# Patient Record
Sex: Male | Born: 1975 | Race: White | Marital: Married | Smoking: Never smoker
Health system: Southern US, Community
[De-identification: ages and names within clinical notes are randomized; demographics above are authoritative.]

## PROBLEM LIST (undated history)

## (undated) NOTE — *Deleted (*Deleted)
  Echocardiogram 2D Echocardiogram has been performed.  Delcie Roch 12/29/2019, 11:55 AM

---

## 2014-12-11 ENCOUNTER — Other Ambulatory Visit: Payer: Self-pay | Admitting: Orthopedic Surgery

## 2014-12-11 DIAGNOSIS — Z139 Encounter for screening, unspecified: Secondary | ICD-10-CM

## 2014-12-11 DIAGNOSIS — M25511 Pain in right shoulder: Secondary | ICD-10-CM

## 2014-12-24 ENCOUNTER — Inpatient Hospital Stay: Admission: RE | Admit: 2014-12-24 | Payer: Self-pay | Source: Ambulatory Visit

## 2014-12-24 ENCOUNTER — Other Ambulatory Visit: Payer: Self-pay

## 2014-12-27 ENCOUNTER — Ambulatory Visit
Admission: RE | Admit: 2014-12-27 | Discharge: 2014-12-27 | Disposition: A | Discharge status: Home / Self Care | Payer: 59 | Source: Ambulatory Visit | Attending: Orthopedic Surgery | Admitting: Orthopedic Surgery

## 2014-12-27 DIAGNOSIS — M25511 Pain in right shoulder: Secondary | ICD-10-CM

## 2014-12-27 DIAGNOSIS — Z139 Encounter for screening, unspecified: Secondary | ICD-10-CM

## 2018-11-24 ENCOUNTER — Other Ambulatory Visit: Payer: Self-pay

## 2018-11-24 DIAGNOSIS — Z20822 Contact with and (suspected) exposure to covid-19: Secondary | ICD-10-CM

## 2018-11-26 LAB — NOVEL CORONAVIRUS, NAA: SARS-CoV-2, NAA: NOT DETECTED

## 2019-09-20 ENCOUNTER — Ambulatory Visit (INDEPENDENT_AMBULATORY_CARE_PROVIDER_SITE_OTHER): Payer: 59 | Admitting: Podiatry

## 2019-09-20 ENCOUNTER — Encounter: Payer: Self-pay | Admitting: Podiatry

## 2019-09-20 ENCOUNTER — Other Ambulatory Visit: Payer: Self-pay

## 2019-09-20 DIAGNOSIS — M79675 Pain in left toe(s): Secondary | ICD-10-CM | POA: Diagnosis not present

## 2019-09-20 DIAGNOSIS — S91209A Unspecified open wound of unspecified toe(s) with damage to nail, initial encounter: Secondary | ICD-10-CM | POA: Diagnosis not present

## 2019-09-20 DIAGNOSIS — S91209S Unspecified open wound of unspecified toe(s) with damage to nail, sequela: Secondary | ICD-10-CM

## 2019-09-20 NOTE — Progress Notes (Signed)
  Subjective:  Patient ID: Antonio Santos, male    DOB: 1975/04/01,  MRN: [id number]  Chief Complaint  Patient presents with  . Nail Problem    Left 1st toenail lost in injury 6-7 months ago, pt states nail is growing back painful.     44 y.o. male presents with the above complaint. History confirmed with patient.  Nail continues to slowly regrow, as soon as it regrows it is quite painful and he tries to pick some of it out but cannot get all of it.  Hurts on both borders and beneath the nail fold.  Objective:  Physical Exam: warm, good capillary refill, no trophic changes or ulcerative lesions, normal DP and PT pulses and normal sensory exam. Left Foot: Left hallux with previous traumatic nail avulsion, I the nailbed is well-healed, very small nail regrowth noted in the nail folds proximally Assessment:   1. Traumatic avulsion of nail plate of toe, sequela   2. Pain in toe of left foot      Plan:  Patient was evaluated and treated and all questions answered.   -Discussed treatment for the condition of the toe in detail with the patient, the nature of nail biology nail regrowth.  He is okay with no longer having a nail and would like an avulsion of the nail with matricectomy.  Today there is not much nail to avulse, and I do not think phenol matricectomy would provide an adequate matricectomy in the office today.  We discussed surgical matricectomy to be performed in the office surgical suite.  Risks and benefits of the procedure were discussed including wound healing complications and need for sutures to heal.  Informed consent was signed and will be scheduled in 2 weeks after he is back from vacation.  Sharl Ma, DPM 09/20/2019     No follow-ups on file.

## 2019-09-26 ENCOUNTER — Telehealth: Payer: Self-pay

## 2019-09-26 NOTE — Telephone Encounter (Signed)
Antonio Santos called to cancel his office surgery on 10/04/19. He stated he has to go out of town for business. He stated he will call back to reschedule once he gets back. Notified Dr. Lilian Kapur

## 2019-09-26 NOTE — Telephone Encounter (Signed)
Sounds good, thank you 

## 2019-10-04 ENCOUNTER — Ambulatory Visit: Payer: 59 | Admitting: Podiatry

## 2019-10-19 ENCOUNTER — Ambulatory Visit: Payer: 59 | Admitting: Podiatry

## 2019-12-28 ENCOUNTER — Emergency Department
Admission: EM | Admit: 2019-12-28 | Discharge: 2019-12-28 | Disposition: A | Discharge status: Home / Self Care | Payer: 59 | Attending: Emergency Medicine | Admitting: Emergency Medicine

## 2019-12-28 ENCOUNTER — Emergency Department (HOSPITAL_COMMUNITY): Payer: 59

## 2019-12-28 ENCOUNTER — Inpatient Hospital Stay (HOSPITAL_COMMUNITY)
Admission: EM | Admit: 2019-12-28 | Discharge: 2019-12-30 | DRG: 065 | Disposition: A | Discharge status: Home / Self Care | Payer: 59 | Attending: Family Medicine | Admitting: Family Medicine

## 2019-12-28 ENCOUNTER — Other Ambulatory Visit: Payer: Self-pay

## 2019-12-28 ENCOUNTER — Emergency Department: Payer: 59

## 2019-12-28 DIAGNOSIS — M549 Dorsalgia, unspecified: Secondary | ICD-10-CM | POA: Diagnosis present

## 2019-12-28 DIAGNOSIS — I6389 Other cerebral infarction: Secondary | ICD-10-CM | POA: Diagnosis not present

## 2019-12-28 DIAGNOSIS — Z79899 Other long term (current) drug therapy: Secondary | ICD-10-CM | POA: Diagnosis not present

## 2019-12-28 DIAGNOSIS — I639 Cerebral infarction, unspecified: Principal | ICD-10-CM | POA: Diagnosis present

## 2019-12-28 DIAGNOSIS — E785 Hyperlipidemia, unspecified: Secondary | ICD-10-CM | POA: Diagnosis present

## 2019-12-28 DIAGNOSIS — R29713 NIHSS score 13: Secondary | ICD-10-CM | POA: Diagnosis present

## 2019-12-28 DIAGNOSIS — G4733 Obstructive sleep apnea (adult) (pediatric): Secondary | ICD-10-CM | POA: Diagnosis not present

## 2019-12-28 DIAGNOSIS — Z6838 Body mass index (BMI) 38.0-38.9, adult: Secondary | ICD-10-CM

## 2019-12-28 DIAGNOSIS — I63331 Cerebral infarction due to thrombosis of right posterior cerebral artery: Secondary | ICD-10-CM | POA: Diagnosis not present

## 2019-12-28 DIAGNOSIS — H532 Diplopia: Secondary | ICD-10-CM | POA: Diagnosis present

## 2019-12-28 DIAGNOSIS — N179 Acute kidney failure, unspecified: Secondary | ICD-10-CM | POA: Diagnosis present

## 2019-12-28 DIAGNOSIS — Z20822 Contact with and (suspected) exposure to covid-19: Secondary | ICD-10-CM | POA: Diagnosis present

## 2019-12-28 DIAGNOSIS — E7800 Pure hypercholesterolemia, unspecified: Secondary | ICD-10-CM | POA: Diagnosis present

## 2019-12-28 DIAGNOSIS — G8194 Hemiplegia, unspecified affecting left nondominant side: Secondary | ICD-10-CM | POA: Diagnosis present

## 2019-12-28 DIAGNOSIS — E669 Obesity, unspecified: Secondary | ICD-10-CM | POA: Diagnosis present

## 2019-12-28 DIAGNOSIS — I351 Nonrheumatic aortic (valve) insufficiency: Secondary | ICD-10-CM | POA: Diagnosis not present

## 2019-12-28 DIAGNOSIS — I10 Essential (primary) hypertension: Secondary | ICD-10-CM | POA: Diagnosis present

## 2019-12-28 DIAGNOSIS — G8929 Other chronic pain: Secondary | ICD-10-CM | POA: Diagnosis present

## 2019-12-28 DIAGNOSIS — R471 Dysarthria and anarthria: Secondary | ICD-10-CM

## 2019-12-28 LAB — DIFFERENTIAL
Abs Immature Granulocytes: 0.06 10*3/uL (ref 0.00–0.07)
Abs Immature Granulocytes: 0.05 10*3/uL (ref 0.00–0.07)
Basophils Absolute: 0.1 10*3/uL (ref 0.0–0.1)
Basophils Absolute: 0.1 10*3/uL (ref 0.0–0.1)
Basophils Relative: 1 %
Basophils Relative: 1 %
Eosinophils Absolute: 0.2 10*3/uL (ref 0.0–0.5)
Eosinophils Absolute: 0.1 10*3/uL (ref 0.0–0.5)
Eosinophils Relative: 2 %
Eosinophils Relative: 2 %
Immature Granulocytes: 1 %
Immature Granulocytes: 1 %
Lymphocytes Relative: 17 %
Lymphocytes Relative: 19 %
Lymphs Abs: 1.4 10*3/uL (ref 0.7–4.0)
Lymphs Abs: 1.5 10*3/uL (ref 0.7–4.0)
Monocytes Absolute: 0.5 10*3/uL (ref 0.1–1.0)
Monocytes Absolute: 0.5 10*3/uL (ref 0.1–1.0)
Monocytes Relative: 6 %
Monocytes Relative: 7 %
Neutro Abs: 5.9 10*3/uL (ref 1.7–7.7)
Neutro Abs: 5.4 10*3/uL (ref 1.7–7.7)
Neutrophils Relative %: 73 %
Neutrophils Relative %: 70 %

## 2019-12-28 LAB — COMPREHENSIVE METABOLIC PANEL
ALT: 31 U/L (ref 0–44)
ALT: 30 U/L (ref 0–44)
AST: 31 U/L (ref 15–41)
AST: 31 U/L (ref 15–41)
Albumin: 4.9 g/dL (ref 3.5–5.0)
Albumin: 4.6 g/dL (ref 3.5–5.0)
Alkaline Phosphatase: 43 U/L (ref 38–126)
Alkaline Phosphatase: 41 U/L (ref 38–126)
Anion gap: 10 (ref 5–15)
Anion gap: 10 (ref 5–15)
BUN: 17 mg/dL (ref 6–20)
BUN: 15 mg/dL (ref 6–20)
CO2: 27 mmol/L (ref 22–32)
CO2: 25 mmol/L (ref 22–32)
Calcium: 10.3 mg/dL (ref 8.9–10.3)
Calcium: 10 mg/dL (ref 8.9–10.3)
Chloride: 102 mmol/L (ref 98–111)
Chloride: 103 mmol/L (ref 98–111)
Creatinine, Ser: 1.24 mg/dL (ref 0.61–1.24)
Creatinine, Ser: 1.37 mg/dL — ABNORMAL HIGH (ref 0.61–1.24)
GFR, Estimated: >60 mL/min (ref >60)
GFR, Estimated: >60 mL/min (ref >60)
Glucose, Bld: 99 mg/dL (ref 70–99)
Glucose, Bld: 119 mg/dL — ABNORMAL HIGH (ref 70–99)
Potassium: 4.1 mmol/L (ref 3.5–5.1)
Potassium: 3.9 mmol/L (ref 3.5–5.1)
Sodium: 139 mmol/L (ref 135–145)
Sodium: 138 mmol/L (ref 135–145)
Total Bilirubin: 0.8 mg/dL (ref 0.3–1.2)
Total Bilirubin: 0.7 mg/dL (ref 0.3–1.2)
Total Protein: 8.3 g/dL — ABNORMAL HIGH (ref 6.5–8.1)
Total Protein: 7.4 g/dL (ref 6.5–8.1)

## 2019-12-28 LAB — CBC
HCT: 40.2 % (ref 39.0–52.0)
HCT: 41.2 % (ref 39.0–52.0)
Hemoglobin: 14 g/dL (ref 13.0–17.0)
Hemoglobin: 13.9 g/dL (ref 13.0–17.0)
MCH: 30.8 pg (ref 26.0–34.0)
MCH: 30.3 pg (ref 26.0–34.0)
MCHC: 34.8 g/dL (ref 30.0–36.0)
MCHC: 33.7 g/dL (ref 30.0–36.0)
MCV: 88.5 fL (ref 80.0–100.0)
MCV: 90 fL (ref 80.0–100.0)
Platelets: 215 10*3/uL (ref 150–400)
Platelets: 211 10*3/uL (ref 150–400)
RBC: 4.54 MIL/uL (ref 4.22–5.81)
RBC: 4.58 MIL/uL (ref 4.22–5.81)
RDW: 12.1 % (ref 11.5–15.5)
RDW: 12.1 % (ref 11.5–15.5)
WBC: 8 10*3/uL (ref 4.0–10.5)
WBC: 7.6 10*3/uL (ref 4.0–10.5)
nRBC: 0 % (ref 0.0–0.2)
nRBC: 0 % (ref 0.0–0.2)

## 2019-12-28 LAB — PROTIME-INR
INR: 1 (ref 0.8–1.2)
INR: 1 (ref 0.8–1.2)
Prothrombin Time: 12.4 seconds (ref 11.4–15.2)
Prothrombin Time: 13.1 seconds (ref 11.4–15.2)

## 2019-12-28 LAB — I-STAT CHEM 8, ED
BUN: 16 mg/dL (ref 6–20)
Calcium, Ion: 1.19 mmol/L (ref 1.15–1.40)
Chloride: 103 mmol/L (ref 98–111)
Creatinine, Ser: 1.3 mg/dL — ABNORMAL HIGH (ref 0.61–1.24)
Glucose, Bld: 115 mg/dL — ABNORMAL HIGH (ref 70–99)
HCT: 40 % (ref 39.0–52.0)
Hemoglobin: 13.6 g/dL (ref 13.0–17.0)
Potassium: 3.8 mmol/L (ref 3.5–5.1)
Sodium: 139 mmol/L (ref 135–145)
TCO2: 25 mmol/L (ref 22–32)

## 2019-12-28 LAB — CBG MONITORING, ED: Glucose-Capillary: 114 mg/dL — ABNORMAL HIGH (ref 70–99)

## 2019-12-28 LAB — HIV ANTIBODY (ROUTINE TESTING W REFLEX): HIV Screen 4th Generation wRfx: NONREACTIVE

## 2019-12-28 LAB — RESPIRATORY PANEL BY RT PCR (FLU A&B, COVID)
Influenza A by PCR: NEGATIVE
Influenza B by PCR: NEGATIVE
SARS Coronavirus 2 by RT PCR: NEGATIVE

## 2019-12-28 LAB — APTT
aPTT: 25 seconds (ref 24–36)
aPTT: 24 seconds (ref 24–36)

## 2019-12-28 LAB — ANTITHROMBIN III: AntiThromb III Func: 105 % (ref 75–120)

## 2019-12-28 MED ORDER — LORAZEPAM 2 MG/ML IJ SOLN
1.0000 mg | Freq: Once | INTRAMUSCULAR | Status: AC
  Administered 2019-12-28: 1 mg via INTRAVENOUS

## 2019-12-28 MED ORDER — GADOBUTROL 1 MMOL/ML IV SOLN
10.0000 mL | Freq: Once | INTRAVENOUS | Status: AC | PRN
  Administered 2019-12-28: 10 mL via INTRAVENOUS

## 2019-12-28 MED ORDER — CLOPIDOGREL BISULFATE 75 MG PO TABS
75.0000 mg | ORAL_TABLET | Freq: Every day | ORAL | Status: DC
  Administered 2019-12-29 – 2019-12-30 (×2): 75 mg via ORAL
  Filled 2019-12-28 (×2): qty 1

## 2019-12-28 MED ORDER — ATORVASTATIN CALCIUM 40 MG PO TABS: 40.0000 mg | ORAL_TABLET | Freq: Every day | ORAL | Status: DC

## 2019-12-28 MED ORDER — IOHEXOL 350 MG/ML SOLN
100.0000 mL | Freq: Once | INTRAVENOUS | Status: AC | PRN
  Administered 2019-12-28: 100 mL via INTRAVENOUS

## 2019-12-28 MED ORDER — LORAZEPAM 2 MG/ML IJ SOLN
INTRAMUSCULAR | Status: AC
  Filled 2019-12-28: qty 1

## 2019-12-28 MED ORDER — SODIUM CHLORIDE 0.9% FLUSH
3.0000 mL | Freq: Once | INTRAVENOUS | Status: AC
Start: 2019-12-28 — End: 2019-12-29
  Administered 2019-12-29: 3 mL via INTRAVENOUS

## 2019-12-28 MED ORDER — ASPIRIN 81 MG PO CHEW
324.0000 mg | CHEWABLE_TABLET | Freq: Once | ORAL | Status: AC
  Administered 2019-12-28: 324 mg via ORAL
  Filled 2019-12-28: qty 4

## 2019-12-28 MED ORDER — POLYETHYLENE GLYCOL 3350 17 G PO PACK: 17.0000 g | PACK | Freq: Every day | ORAL | Status: DC | PRN

## 2019-12-28 MED ORDER — ACETAMINOPHEN 325 MG PO TABS: 650.0000 mg | ORAL_TABLET | Freq: Four times a day (QID) | ORAL | Status: DC | PRN

## 2019-12-28 MED ORDER — ASPIRIN EC 81 MG PO TBEC
81.0000 mg | DELAYED_RELEASE_TABLET | Freq: Every day | ORAL | Status: DC
  Administered 2019-12-29 – 2019-12-30 (×2): 81 mg via ORAL
  Filled 2019-12-28 (×2): qty 1

## 2019-12-28 MED ORDER — OXYCODONE-ACETAMINOPHEN 5-325 MG PO TABS
1.0000 | ORAL_TABLET | Freq: Four times a day (QID) | ORAL | Status: DC | PRN
  Administered 2019-12-29 – 2019-12-30 (×4): 1 via ORAL
  Filled 2019-12-28 (×4): qty 1

## 2019-12-28 MED ORDER — ACETAMINOPHEN 650 MG RE SUPP: 650.0000 mg | Freq: Four times a day (QID) | RECTAL | Status: DC | PRN

## 2019-12-28 MED ORDER — SODIUM CHLORIDE 0.9% FLUSH
3.0000 mL | Freq: Once | INTRAVENOUS | Status: AC
  Administered 2019-12-28: 3 mL via INTRAVENOUS

## 2019-12-28 NOTE — ED Notes (Signed)
Pt ambulated to bathroom with orders from RN. Pt was stable on feet and leaned more to his left side. Left sided weakness was shown.

## 2019-12-28 NOTE — Code Documentation (Signed)
Stroke Response Nurse Documentation Code Documentation  Antonio Santos is a 44 y.o. male arriving to [facility] ED via [facility] on 10/21 with no significant past medical hx. Code stroke was activated by EMS. Patient from opthalmology where he was LKW at 56. Pt was seen this morning at [facility] after he had sudden onset of double vision at 0645. MRI/MRA completed and both read normal. Pt was discharged and wife took him to her work place which is an opthalmology office to be evaluated. While there at 1420, pt had sudden onset of left facial droop, left sided weakness, right gaze, and left neglect.EMS called and activated a Code Stroke.  Stroke team at the bedside on patient arrival. Labs drawn and patient cleared for CT by Dr. Madilyn Hook. Patient to CT with team. NIHSS 13, see documentation for details and code stroke times. Patient with right gaze preference , left hemianopia, left facial droop, left arm weakness, left leg weakness, dysarthria  and Sensory  neglect on exam. The following imaging was completed:  CT, CTA head and neck, CTP. Patient is not a candidate for tPA due to being outside window.   Patient taken to MRI to confirm no LVO due to abnormal CTA with exam. MRI completed. 1 mg of Ativan given while in scanner due to patient being unable to stay still. See MAR. No LVO noted per MD Wilford Corner. Care/Plan: Taken Back to ED, Q2 VS/mNIHSS, Admit. Bedside handoff with ED RN Byrd Hesselbach.     Lucila Maine  Stroke Response RN

## 2019-12-28 NOTE — ED Notes (Signed)
Pt wife at bedside at this time.  

## 2019-12-28 NOTE — Discharge Instructions (Addendum)
Please follow up with ophthalmology. Please seek medical attention for any high fevers, chest pain, shortness of breath, change in behavior, persistent vomiting, bloody stool or any other new or concerning symptoms.

## 2019-12-28 NOTE — ED Notes (Signed)
Pt back from MRI at this time

## 2019-12-28 NOTE — Progress Notes (Signed)
CODE STROKE- PHARMACY COMMUNICATION   Time CODE STROKE called/page received: 0900  Time response to CODE STROKE was made (in person or via phone): 0910  Time Stroke Kit retrieved from [facility] (only if needed): NA  Name of Provider/Nurse contacted: Dr. Josefina Do ,PharmD Clinical Pharmacist  12/28/2019  9:45 AM

## 2019-12-28 NOTE — Consult Note (Addendum)
Neurology Consultation  Reason for Consult: Code stroke Referring Physician: Cathren Laine, MD  CC: Left-sided weakness  History is obtained from: EMS and notes  HPI: Antonio Santos is a 43 y.o. male past medical history of obesity, back pain, hypercholesterolemia, seen this morning at [facility] hospital for visual disturbance, concerning for posterior circulation stroke with reported negative MRI and discharge to follow-up with ophthalmology presented for evaluation of left-sided symptoms concerning for stroke. This morning at [facility], patient's chief complaint was sudden onset of double vision.  Apparently he [place] the [place] when suddenly he stated that he could not see.  His friend had to grab the wheel and pulled the car over to the side.  When in the ED at [facility] patient notes double vision with both eyes open.  When gazing objects close up, he had no double vision but there was some blurring when looking at close objects on far upward gaze.  He also was noted to have fluctuating vertical diplopia when upper gaze at exit sign that was about 25 feet away.  At that time NIH stroke scale was zero.  MRI brain at that time reportedly showed no intracranial abnormalities especially no acute infarct.  MRA of brain showed no evidence of acute intracranial abnormality.  He was discharged from the ER and recommended to follow-up with ophthalmology.  His wife works as an ophthalmology office and he went to see be ophthalmologist when he was suddenly noticed to have a rightward gaze and left arm flaccidity and left leg weakness along with left facial droop.  EMS was called and patient was brought to [facility] as code stroke.  Patient continued to have symptoms however he was outside the window for TPA.  Patient was immediately brought to CT scan where he had a noncontrast head CT and CTA of head and neck along with perfusion.  CT head showed no acute  intracranial hemorrhage.  Possible small area of acute infarction in the inferior medial right thalamus.  No emergent LVO on CTA  Due to CT findings patient was brought MRI which did confirm that patient had a inferior medial right thalamic infarct, which in hindsight was subtly present during the first MRI done at [facility] as well.   LKW: 6:45 a.m., 12/28/2019 tpa given?: no, out of window Premorbid modified Rankin scale (mRS): 0 NIHSS 1a Level of Conscious.: 0 1b LOC Questions: 0 1c LOC Commands: 0 2 Best Gaze: 1 3 Visual: 2 4 Facial Palsy: 2 5a Motor Arm - left: 3 5b Motor Arm - Right: 0 6a Motor Leg - Left: 2 6b Motor Leg - Right: 0 7 Limb Ataxia: 0 8 Sensory: 0 9 Best Language: 0 10 Dysarthria: 2 11 Extinct. and Inatten.: 1 TOTAL: 13    Past medical history: Back pain Obesity Hypercholesterolemia  No family history on file.  Social History:   has no history on file for tobacco use, alcohol use, and drug use.  Medications  Current Facility-Administered Medications:  .  sodium chloride flush (NS) 0.9 % injection 3 mL, 3 mL, Intravenous, Once, Cathren Laine, MD  Current Outpatient Medications:  .  fenofibrate micronized (LOFIBRA) 200 MG capsule, Take 200 mg by mouth daily. , Disp: , Rfl:  .  oxyCODONE-acetaminophen (PERCOCET/ROXICET) 5-325 MG tablet, Take 1 tablet by mouth 4 (four) times daily as needed., Disp: , Rfl:  .  cyclobenzaprine (FLEXERIL) 10 MG tablet, Take 10 mg by mouth 3 (three) times  daily as needed. (Patient not taking: Reported on 12/28/2019), Disp: , Rfl:  .  traZODone (DESYREL) 100 MG tablet, Take 100 mg by mouth at bedtime. (Patient not taking: Reported on 12/28/2019), Disp: , Rfl:   ROS:    General ROS: negative for - chills, fatigue, fever, night sweats, weight gain or weight loss Psychological ROS: negative for - behavioral disorder, hallucinations, memory difficulties, mood swings or suicidal ideation Ophthalmic ROS: Positive for  - blurry vision, double vision ENT ROS: negative for - epistaxis, nasal discharge, oral lesions, sore throat, tinnitus or vertigo Respiratory ROS: negative for - cough, hemoptysis, shortness of breath or wheezing Cardiovascular ROS: negative for - chest pain, dyspnea on exertion, edema or irregular heartbeat Gastrointestinal ROS: negative for - abdominal pain, diarrhea, hematemesis, nausea/vomiting or stool incontinence Genito-Urinary ROS: negative for - dysuria, hematuria, incontinence or urinary frequency/urgency Musculoskeletal ROS: Positive for -muscular weakness Neurological ROS: as noted in HPI Dermatological ROS: negative for rash and skin lesion changes  Exam: Current vital signs: There were no vitals taken for this visit. Vital signs in last 24 hours: Temp:  [98.9 F (37.2 C)] 98.9 F (37.2 C) (10/21 0853) Pulse Rate:  [82-95] 86 (10/21 1229) Resp:  [15-22] 18 (10/21 1229) BP: (134-165)/(77-85) 141/79 (10/21 1229) SpO2:  [97 %-100 %] 97 % (10/21 1229) Weight:  [129.3 kg] 129.3 kg (10/21 0917)   Constitutional: Appears well-developed and well-nourished.  Psych: Affect appropriate to situation Eyes: No scleral injection HENT: No OP obstrucion Head: Normocephalic.  Cardiovascular: Normal rate and regular rhythm.  Respiratory: Effort normal, non-labored breathing GI: Soft.  No distension. There is no tenderness.  Skin: WDI  Neuro: Mental Status: Patient is awake, alert, oriented to person, place, month, year, and situation. Speech did show dysarthria but no aphasia.  Patient was able to follow commands Cranial Nerves: II: Complete left hemianopsia III,IV, VI: EOMI without ptosis or diploplia. Pupils equal, round and reactive to light V: Facial sensation is symmetric to temperature VII: Left facial droop VIII: hearing is intact to voice XI: Shoulder shrug is symmetric. XII: tongue is midline without atrophy or fasciculations.  Motor: Left arm was flaccid, right arm  moved with no problems against gravity, left leg had drift but was able to give effort against gravity right leg showed no drift. Sensory: Sensation is symmetric to light touch and temperature in the arms and legs. DSS- positive on left  Labs I have reviewed labs in epic and the results pertinent to this consultation are:   CBC    Component Value Date/Time   WBC 7.6 12/28/2019 1455   RBC 4.58 12/28/2019 1455   HGB 13.6 12/28/2019 1504   HCT 40.0 12/28/2019 1504   PLT 211 12/28/2019 1455   MCV 90.0 12/28/2019 1455   MCH 30.3 12/28/2019 1455   MCHC 33.7 12/28/2019 1455   RDW 12.1 12/28/2019 1455   LYMPHSABS 1.5 12/28/2019 1455   MONOABS 0.5 12/28/2019 1455   EOSABS 0.1 12/28/2019 1455   BASOSABS 0.1 12/28/2019 1455    CMP     Component Value Date/Time   NA 139 12/28/2019 1504   K 3.8 12/28/2019 1504   CL 103 12/28/2019 1504   CO2 25 12/28/2019 1455   GLUCOSE 115 (H) 12/28/2019 1504   BUN 16 12/28/2019 1504   CREATININE 1.30 (H) 12/28/2019 1504   CALCIUM 10.0 12/28/2019 1455   PROT 7.4 12/28/2019 1455   ALBUMIN 4.6 12/28/2019 1455   AST 31 12/28/2019 1455   ALT 30 12/28/2019 1455  ALKPHOS 41 12/28/2019 1455   BILITOT 0.7 12/28/2019 1455   GFRNONAA >60 12/28/2019 1455    Lipid Panel  No results found for: CHOL, TRIG, HDL, CHOLHDL, VLDL, LDLCALC, LDLDIRECT   Imaging I have reviewed the images obtained:  CT-scan of the brain-no acute intracranial hemorrhage.  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  Felicie Mornavid Smith PA-C [facility] [phone]  M-F  (9:00 am- 5:00 PM)  12/28/2019, 3:31 PM   Attending addendum Patient seen and examined as acute code stroke at the request of Dr. Denton LankSteinl. Was seen as a acute code stroke this  morning for visual disturbances but MRI was read as negative and was discharged with follow-up to ophthalmology.  He was at the ophthalmologist office when he had sudden onset of left-sided symptoms as described above. On examination in the emergency room, NIH stroke scale was 13. I have independently reviewed imaging. I have independently obtained history and review of systems  Assessment:  This is a 44 year old unfortunate male who presented today [place] early in the morning with double vision.  At that point MRI and MRA reportedly did not show any abnormalities thus patient was sent to ophthalmology for possible subtle cranial nerve palsy causing diplopia or any intraocular cause for diplopia.  While at the ophthalmologist patient had sudden onset of right gaze deviation, left-sided weakness and brought in as a code stroke to [facility]. Patient unfortunately is not a TPA candidate as his last known normal was 6:45 AM this morning and his symptoms never completely resolved.  Repeat head CT with concern for possible right thalamic hypodensity.  The CT angio head and neck along with perfusion were obtained.  No large vessel occlusion or hemodynamically significant stenosis was evident-although there was some concern for possible right MCA M2 occlusion but found to be artifactual.  CT perfusion study also was unremarkable-poor quality, showed some penumbra on the left MCA territory which is completely unrelated to his symptoms.  He was taken for stat MRI that did show a positive right acute infarct in the inferior medial thalamus, which looking back in hindsight was also's very subtly present in the MRI from this morning as well as subtle restricted DWI on left thalamus as well. His visual symptoms are probably related to stroke emanating from the posterior circulation, which also supplies the thalamus. His current examination including the sensation, neglect as well as motor symptoms can be  attributed to the inferior thalamic/superior hypothalamic stroke. Bilateral thalamic strokes can also explain mentation decline. He does require stroke work-up including hypercoagulable work-up given his young age.  Not a candidate for TPA being outside the window as the last known normal is in the morning since the symptoms never completely 100% resolved. No emergent LVO on imaging his not a candidate for EVT.  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  Recommend -Transthoracic Echo, may need TEE and loop monitor  -Start patient on ASA 325mg  daily  -Start or continue Atorvastatin 80 mg/other high intensity statin -BP goal: permissive HTN upto 220/120 mmHg -HbA1C and Lipid profile -Telemetry monitoring -Frequent neuro checks -NPO until passes stroke  swallow screen -PT/OT -Hypercoagulable panel # please page stroke NP  Or  PA  Or MD from 8am -4 pm  as this patient from this time will be  followed by the stroke.   You can look them up on [email or web address]  Password [email or web address]  -- Milon Dikes, MD [facility] Pager: [phone] If 7pm to 7am, please call on call as listed on AMION.   CRITICAL CARE ATTESTATION Performed by: Milon Dikes, MD Total critical care time:70 minutes Critical care time was exclusive of separately billable procedures and treating other patients and/or supervising APPs/Residents/Students Critical care was necessary to treat or prevent imminent or life-threatening deterioration due to acute ischemic stroke, possible consideration for IV thrombolysis and endovascular thrombectomy for which he did not eventually qualify. This patient is critically ill and at significant risk for neurological worsening and/or death and care requires constant monitoring. Critical care was time spent  personally by me on the following activities: development of treatment plan with patient and/or surrogate as well as nursing, discussions with consultants, evaluation of patient's response to treatment, examination of patient, obtaining history from patient or surrogate, ordering and performing treatments and interventions, ordering and review of laboratory studies, ordering and review of radiographic studies, pulse oximetry, re-evaluation of patient's condition, participation in multidisciplinary rounds and medical decision making of high complexity in the care of this patient.

## 2019-12-28 NOTE — ED Provider Notes (Signed)
[facility] EMERGENCY DEPARTMENT Provider Note   CSN: [id number] Arrival date & time: 12/28/19  1453     History Chief Complaint  Patient presents with  . Code Stroke    BIB EMS for stroke symptoms that started at 0645. Went to [facility] and was diagnosed with tunnel vision. Symptoms were slurred speech, left facial droop and left side weakness.     Antonio Santos is a 44 y.o. male.  Patient c/o visual changes, slurred speech, and left weakness, acute onset around 645 this AM when driving in car. States initial symptoms were 'double vision', but that later developed the left weakness. Symptoms acute onset, moderate, persistent. He was seen at [facility] in ED and had CT and MRI/MRA neg for acute process. Denies hx same symptoms. No headache. No neck or back pain. No chest pain or sob. No abd pain or nv. Denies fever or chills.   The history is provided by the patient.       No past medical history on file.  There are no problems to display for this patient.   No past surgical history on file.     No family history on file.  Social History   Tobacco Use  . Smoking status: Not on file  Substance Use Topics  . Alcohol use: Not on file  . Drug use: Not on file    Home Medications Prior to Admission medications   Medication Sig Start Date End Date Taking? Authorizing Provider  fenofibrate micronized (LOFIBRA) 200 MG capsule Take 200 mg by mouth daily.  08/28/19  Yes [provider]  oxyCODONE-acetaminophen (PERCOCET/ROXICET) 5-325 MG tablet Take 1 tablet by mouth 4 (four) times daily as needed. 09/01/19  Yes [provider]  cyclobenzaprine (FLEXERIL) 10 MG tablet Take 10 mg by mouth 3 (three) times daily as needed. Patient not taking: Reported on 12/28/2019 07/14/19   [provider]  traZODone (DESYREL) 100 MG tablet Take 100 mg by mouth at bedtime. Patient not taking: Reported on 12/28/2019 08/28/19   [provider]     Allergies    Penicillins  Review of Systems   Review of Systems  Constitutional: Negative for fever.  HENT: Negative for sore throat and trouble swallowing.   Eyes: Positive for visual disturbance. Negative for photophobia, pain and redness.  Respiratory: Negative for cough and shortness of breath.   Cardiovascular: Negative for chest pain.  Gastrointestinal: Negative for abdominal pain, diarrhea and vomiting.  Endocrine: Negative for polyuria.  Genitourinary: Negative for dysuria and flank pain.  Musculoskeletal: Negative for back pain and neck pain.  Skin: Negative for rash.  Neurological: Positive for weakness. Negative for headaches.  Hematological: Does not bruise/bleed easily.  Psychiatric/Behavioral: Negative for confusion.    Physical Exam Updated Vital Signs There were no vitals taken for this visit.  Physical Exam Vitals and nursing note reviewed.  Constitutional:      Appearance: Normal appearance. He is well-developed.  HENT:     Head: Atraumatic.     Nose: Nose normal.     Mouth/Throat:     Mouth: Mucous membranes are moist.     Pharynx: Oropharynx is clear.  Eyes:     General: No scleral icterus.    Conjunctiva/sclera: Conjunctivae normal.     Pupils: Pupils are equal, round, and reactive to light.     Comments: Gaze to right.   Neck:     Vascular: No carotid bruit.     Trachea: No tracheal deviation.  Cardiovascular:     Rate and Rhythm: Normal rate and regular rhythm.     Pulses: Normal pulses.     Heart sounds: Normal heart sounds. No murmur heard.  No friction rub. No gallop.   Pulmonary:     Effort: Pulmonary effort is normal. No accessory muscle usage or respiratory distress.     Breath sounds: Normal breath sounds.  Abdominal:     General: Bowel sounds are normal. There is no distension.     Palpations: Abdomen is soft.     Tenderness: There is no abdominal tenderness. There is no guarding.  Genitourinary:    Comments: No cva  tenderness. Musculoskeletal:        General: No swelling or tenderness.     Cervical back: Normal range of motion and neck supple. No rigidity.     Right lower leg: No edema.     Left lower leg: No edema.     Comments: CTLS spine, non tender, aligned, no step off.   Skin:    General: Skin is warm and dry.     Findings: No rash.  Neurological:     Mental Status: He is alert.     Comments: Alert. Speech: pt somewhat slow to respond and with quiet speech, although not grossly dysarthric ?aphasia. Will not hold LUE up against gravity, but will grip with left hand although slightly weaker than right. LUE 3/5. Withdraws bil legs briskly to stimuli to feet. ?weakness LLE ?4/5.  Psychiatric:     Comments: Unusual affect.       ED Results / Procedures / Treatments   Labs (all labs ordered are listed, but only abnormal results are displayed) Results for orders placed or performed during the hospital encounter of 12/28/19  Respiratory Panel by RT PCR (Flu A&B, Covid) - Nasopharyngeal Swab   Specimen: Nasopharyngeal Swab  Result Value Ref Range   SARS Coronavirus 2 by RT PCR NEGATIVE NEGATIVE   Influenza A by PCR NEGATIVE NEGATIVE   Influenza B by PCR NEGATIVE NEGATIVE  Protime-INR  Result Value Ref Range   Prothrombin Time 13.1 11.4 - 15.2 seconds   INR 1.0 0.8 - 1.2  APTT  Result Value Ref Range   aPTT 24 24 - 36 seconds  CBC  Result Value Ref Range   WBC 7.6 4.0 - 10.5 K/uL   RBC 4.58 4.22 - 5.81 MIL/uL   Hemoglobin 13.9 13.0 - 17.0 g/dL   HCT 16.1 39 - 52 %   MCV 90.0 80.0 - 100.0 fL   MCH 30.3 26.0 - 34.0 pg   MCHC 33.7 30.0 - 36.0 g/dL   RDW 09.6 04.5 - 40.9 %   Platelets 211 150 - 400 K/uL   nRBC 0.0 0.0 - 0.2 %  Differential  Result Value Ref Range   Neutrophils Relative % 70 %   Neutro Abs 5.4 1.7 - 7.7 K/uL   Lymphocytes Relative 19 %   Lymphs Abs 1.5 0.7 - 4.0 K/uL   Monocytes Relative 7 %   Monocytes Absolute 0.5 0.1 - 1.0 K/uL   Eosinophils Relative 2 %    Eosinophils Absolute 0.1 0.0 - 0.5 K/uL   Basophils Relative 1 %   Basophils Absolute 0.1 0.0 - 0.1 K/uL   Immature Granulocytes 1 %   Abs Immature Granulocytes 0.05 0.00 - 0.07 K/uL  Comprehensive metabolic panel  Result Value Ref Range   Sodium 138 135 - 145 mmol/L   Potassium 3.9 3.5 - 5.1 mmol/L  Chloride 103 98 - 111 mmol/L   CO2 25 22 - 32 mmol/L   Glucose, Bld 119 (H) 70 - 99 mg/dL   BUN 15 6 - 20 mg/dL   Creatinine, Ser 0.25 (H) 0.61 - 1.24 mg/dL   Calcium 42.7 8.9 - 06.2 mg/dL   Total Protein 7.4 6.5 - 8.1 g/dL   Albumin 4.6 3.5 - 5.0 g/dL   AST 31 15 - 41 U/L   ALT 30 0 - 44 U/L   Alkaline Phosphatase 41 38 - 126 U/L   Total Bilirubin 0.7 0.3 - 1.2 mg/dL   GFR, Estimated >37 >62 mL/min   Anion gap 10 5 - 15  I-stat chem 8, ED  Result Value Ref Range   Sodium 139 135 - 145 mmol/L   Potassium 3.8 3.5 - 5.1 mmol/L   Chloride 103 98 - 111 mmol/L   BUN 16 6 - 20 mg/dL   Creatinine, Ser 8.31 (H) 0.61 - 1.24 mg/dL   Glucose, Bld 517 (H) 70 - 99 mg/dL   Calcium, Ion 6.16 0.73 - 1.40 mmol/L   TCO2 25 22 - 32 mmol/L   Hemoglobin 13.6 13.0 - 17.0 g/dL   HCT 71.0 39 - 52 %  CBG monitoring, ED  Result Value Ref Range   Glucose-Capillary 114 (H) 70 - 99 mg/dL   Comment 1 Notify RN    Comment 2 Document in Chart    MR ANGIO HEAD WO CONTRAST  Result Date: 12/28/2019 CLINICAL DATA:  Neuro deficit, acute stroke suspected. Acute double vision. EXAM: MRI HEAD WITHOUT AND WITH CONTRAST MRA HEAD WITHOUT CONTRAST TECHNIQUE: Multiplanar, multiecho pulse sequences of the brain and surrounding structures were obtained without and with intravenous contrast. Angiographic images of the Circle of Willis were obtained using MRA technique without intravenous contrast. CONTRAST:  14mL GADAVIST GADOBUTROL 1 MMOL/ML IV SOLN COMPARISON:  CT head from the same day FINDINGS: MRI HEAD FINDINGS Brain: No acute infarction, hemorrhage, hydrocephalus, extra-axial collection or mass lesion. Apparent DWI  hyperintensity in the medial right thalamus is favored artifactual given apparent linear artifact through this region. No abnormal enhancement. Skull and upper cervical spine: Normal marrow signal. Sinuses/Orbits: Mild scattered ethmoid air cell mucosal thickening. Unremarkable orbits. Other: No mastoid effusions. MRA HEAD FINDINGS No evidence of significant (greater than 50%) stenosis or proximal large vessel occlusion. No aneurysm. Diminutive right vertebral artery (likely non dominant) which appears to terminate as PICA. Bilateral posterior communicating arteries. IMPRESSION: 1. No evidence of acute intracranial abnormality. Specifically, no acute infarct. 2. No hemodynamically significant stenosis or proximal large vessel occlusion. Electronically Signed   By: Feliberto Harts MD   On: 12/28/2019 11:49   MR BRAIN W WO CONTRAST  Result Date: 12/28/2019 CLINICAL DATA:  Neuro deficit, acute stroke suspected. Acute double vision. EXAM: MRI HEAD WITHOUT AND WITH CONTRAST MRA HEAD WITHOUT CONTRAST TECHNIQUE: Multiplanar, multiecho pulse sequences of the brain and surrounding structures were obtained without and with intravenous contrast. Angiographic images of the Circle of Willis were obtained using MRA technique without intravenous contrast. CONTRAST:  26mL GADAVIST GADOBUTROL 1 MMOL/ML IV SOLN COMPARISON:  CT head from the same day FINDINGS: MRI HEAD FINDINGS Brain: No acute infarction, hemorrhage, hydrocephalus, extra-axial collection or mass lesion. Apparent DWI hyperintensity in the medial right thalamus is favored artifactual given apparent linear artifact through this region. No abnormal enhancement. Skull and upper cervical spine: Normal marrow signal. Sinuses/Orbits: Mild scattered ethmoid air cell mucosal thickening. Unremarkable orbits. Other: No mastoid effusions. MRA HEAD  FINDINGS No evidence of significant (greater than 50%) stenosis or proximal large vessel occlusion. No aneurysm. Diminutive  right vertebral artery (likely non dominant) which appears to terminate as PICA. Bilateral posterior communicating arteries. IMPRESSION: 1. No evidence of acute intracranial abnormality. Specifically, no acute infarct. 2. No hemodynamically significant stenosis or proximal large vessel occlusion. Electronically Signed   By: Feliberto Harts MD   On: 12/28/2019 11:49   CT HEAD CODE STROKE WO CONTRAST  Result Date: 12/28/2019 CLINICAL DATA:  Code stroke.  Left facial droop and arm weakness EXAM: CT HEAD WITHOUT CONTRAST TECHNIQUE: Contiguous axial images were obtained from the base of the skull through the vertex without intravenous contrast. COMPARISON:  Earlier same day FINDINGS: Brain: No acute intracranial hemorrhage, mass effect, or edema. No new loss of gray-white differentiation. Possible small area hypoattenuation in the inferior medial right thalamus (series 3, image 17). There is corresponding mild diffusion hyperintensity on the prior MRI. Ventricles are stable in size. Vascular: No new hyperdense vessel. Skull: Unremarkable. Sinuses/Orbits: No acute abnormality. Other: None. ASPECTS [facility] Stroke Program Early CT Score) - Ganglionic level infarction (caudate, lentiform nuclei, internal capsule, insula, M1-M3 cortex): 7 - Supraganglionic infarction (M4-M6 cortex): 3 Total score (0-10 with 10 being normal): 10 IMPRESSION: No acute intracranial hemorrhage. Possible small area of acute infarction in the inferior medial right thalamus, noting corresponding mild diffusion hyperintensity on the prior MRI These results were communicated to Dr. Wilford Corner at 3:05 pm on 12/28/2019 by text page via the [facility] messaging system. Electronically Signed   By: Guadlupe Spanish M.D.   On: 12/28/2019 15:15   CT HEAD CODE STROKE WO CONTRAST  Result Date: 12/28/2019 CLINICAL DATA:  Code stroke.  Neuro deficit EXAM: CT HEAD WITHOUT CONTRAST TECHNIQUE: Contiguous axial images were obtained from the base of the skull through  the vertex without intravenous contrast. COMPARISON:  None. FINDINGS: Brain: No acute infarct or intracranial hemorrhage. No mass lesion. No midline shift, ventriculomegaly or extra-axial fluid collection. Vascular: No hyperdense vessel or unexpected calcification. Skull: Negative for fracture or focal lesion. Sinuses/Orbits: Normal orbits. Clear paranasal sinuses. No mastoid effusion. Other: None. ASPECTS [facility] Stroke Program Early CT Score) - Ganglionic level infarction (caudate, lentiform nuclei, internal capsule, insula, M1-M3 cortex): 7 - Supraganglionic infarction (M4-M6 cortex): 3 Total score (0-10 with 10 being normal): 10 IMPRESSION: 1. No acute intracranial process. 2. ASPECTS is 10 Code stroke imaging results were communicated on 12/28/2019 at 9:07 am to provider Dr. Derrill Kay via choose Electronically Signed   By: Stana Bunting M.D.   On: 12/28/2019 09:13    EKG EKG Interpretation  Date/Time:  Thursday December 28 2019 15:30:13 EDT Ventricular Rate:  79 PR Interval:    QRS Duration: 94 QT Interval:  375 QTC Calculation: 430 R Axis:   80 Text Interpretation: Sinus rhythm No significant change since last tracing Confirmed by Cathren Laine ([id number]) on 12/28/2019 3:37:47 PM   Radiology MR ANGIO HEAD WO CONTRAST  Result Date: 12/28/2019 CLINICAL DATA:  Neuro deficit, acute stroke suspected. Acute double vision. EXAM: MRI HEAD WITHOUT AND WITH CONTRAST MRA HEAD WITHOUT CONTRAST TECHNIQUE: Multiplanar, multiecho pulse sequences of the brain and surrounding structures were obtained without and with intravenous contrast. Angiographic images of the Circle of Willis were obtained using MRA technique without intravenous contrast. CONTRAST:  40mL GADAVIST GADOBUTROL 1 MMOL/ML IV SOLN COMPARISON:  CT head from the same day FINDINGS: MRI HEAD FINDINGS Brain: No acute infarction, hemorrhage, hydrocephalus, extra-axial collection or mass lesion. Apparent DWI hyperintensity  in the medial right  thalamus is favored artifactual given apparent linear artifact through this region. No abnormal enhancement. Skull and upper cervical spine: Normal marrow signal. Sinuses/Orbits: Mild scattered ethmoid air cell mucosal thickening. Unremarkable orbits. Other: No mastoid effusions. MRA HEAD FINDINGS No evidence of significant (greater than 50%) stenosis or proximal large vessel occlusion. No aneurysm. Diminutive right vertebral artery (likely non dominant) which appears to terminate as PICA. Bilateral posterior communicating arteries. IMPRESSION: 1. No evidence of acute intracranial abnormality. Specifically, no acute infarct. 2. No hemodynamically significant stenosis or proximal large vessel occlusion. Electronically Signed   By: Feliberto Harts MD   On: 12/28/2019 11:49   MR BRAIN W WO CONTRAST  Result Date: 12/28/2019 CLINICAL DATA:  Neuro deficit, acute stroke suspected. Acute double vision. EXAM: MRI HEAD WITHOUT AND WITH CONTRAST MRA HEAD WITHOUT CONTRAST TECHNIQUE: Multiplanar, multiecho pulse sequences of the brain and surrounding structures were obtained without and with intravenous contrast. Angiographic images of the Circle of Willis were obtained using MRA technique without intravenous contrast. CONTRAST:  41mL GADAVIST GADOBUTROL 1 MMOL/ML IV SOLN COMPARISON:  CT head from the same day FINDINGS: MRI HEAD FINDINGS Brain: No acute infarction, hemorrhage, hydrocephalus, extra-axial collection or mass lesion. Apparent DWI hyperintensity in the medial right thalamus is favored artifactual given apparent linear artifact through this region. No abnormal enhancement. Skull and upper cervical spine: Normal marrow signal. Sinuses/Orbits: Mild scattered ethmoid air cell mucosal thickening. Unremarkable orbits. Other: No mastoid effusions. MRA HEAD FINDINGS No evidence of significant (greater than 50%) stenosis or proximal large vessel occlusion. No aneurysm. Diminutive right vertebral artery (likely non  dominant) which appears to terminate as PICA. Bilateral posterior communicating arteries. IMPRESSION: 1. No evidence of acute intracranial abnormality. Specifically, no acute infarct. 2. No hemodynamically significant stenosis or proximal large vessel occlusion. Electronically Signed   By: Feliberto Harts MD   On: 12/28/2019 11:49   CT HEAD CODE STROKE WO CONTRAST  Result Date: 12/28/2019 CLINICAL DATA:  Code stroke.  Left facial droop and arm weakness EXAM: CT HEAD WITHOUT CONTRAST TECHNIQUE: Contiguous axial images were obtained from the base of the skull through the vertex without intravenous contrast. COMPARISON:  Earlier same day FINDINGS: Brain: No acute intracranial hemorrhage, mass effect, or edema. No new loss of gray-white differentiation. Possible small area hypoattenuation in the inferior medial right thalamus (series 3, image 17). There is corresponding mild diffusion hyperintensity on the prior MRI. Ventricles are stable in size. Vascular: No new hyperdense vessel. Skull: Unremarkable. Sinuses/Orbits: No acute abnormality. Other: None. ASPECTS [facility] Stroke Program Early CT Score) - Ganglionic level infarction (caudate, lentiform nuclei, internal capsule, insula, M1-M3 cortex): 7 - Supraganglionic infarction (M4-M6 cortex): 3 Total score (0-10 with 10 being normal): 10 IMPRESSION: No acute intracranial hemorrhage. Possible small area of acute infarction in the inferior medial right thalamus, noting corresponding mild diffusion hyperintensity on the prior MRI These results were communicated to Dr. Wilford Corner at 3:05 pm on 12/28/2019 by text page via the [facility] messaging system. Electronically Signed   By: Guadlupe Spanish M.D.   On: 12/28/2019 15:15   CT HEAD CODE STROKE WO CONTRAST  Result Date: 12/28/2019 CLINICAL DATA:  Code stroke.  Neuro deficit EXAM: CT HEAD WITHOUT CONTRAST TECHNIQUE: Contiguous axial images were obtained from the base of the skull through the vertex without intravenous  contrast. COMPARISON:  None. FINDINGS: Brain: No acute infarct or intracranial hemorrhage. No mass lesion. No midline shift, ventriculomegaly or extra-axial fluid collection. Vascular: No hyperdense vessel or  unexpected calcification. Skull: Negative for fracture or focal lesion. Sinuses/Orbits: Normal orbits. Clear paranasal sinuses. No mastoid effusion. Other: None. ASPECTS [facility] Stroke Program Early CT Score) - Ganglionic level infarction (caudate, lentiform nuclei, internal capsule, insula, M1-M3 cortex): 7 - Supraganglionic infarction (M4-M6 cortex): 3 Total score (0-10 with 10 being normal): 10 IMPRESSION: 1. No acute intracranial process. 2. ASPECTS is 10 Code stroke imaging results were communicated on 12/28/2019 at 9:07 am to provider Dr. Derrill Kay via choose Electronically Signed   By: Stana Bunting M.D.   On: 12/28/2019 09:13    Procedures Procedures (including critical care time)  Medications Ordered in ED Medications  sodium chloride flush (NS) 0.9 % injection 3 mL (has no administration in time range)  iohexol (OMNIPAQUE) 350 MG/ML injection 100 mL (100 mLs Intravenous Contrast Given 12/28/19 1515)    ED Course  I have reviewed the triage vital signs and the nursing notes.  Pertinent labs & imaging results that were available during my care of the patient were reviewed by me and considered in my medical decision making (see chart for details).    MDM Rules/Calculators/A&P                         Iv ns. Stat labs and imaging. Code stroke activation. Neurology consulted. Continuous pulse ox and continuous cardiac monitoring.   MDM Number of Diagnoses or Management Options   Amount and/or Complexity of Data Reviewed Clinical lab tests: ordered and reviewed Tests in the radiology section of CPT: ordered and reviewed Tests in the medicine section of CPT: ordered and reviewed Discussion of test results with the performing providers: yes Decide to obtain previous medical  records or to obtain history from someone other than the patient: yes Obtain history from someone other than the patient: yes Review and summarize past medical records: yes Discuss the patient with other providers: yes Independent visualization of images, tracings, or specimens: yes  Risk of Complications, Morbidity, and/or Mortality Presenting problems: high Diagnostic procedures: high Management options: high   Reviewed nursing notes and prior charts for additional history.   Labs reviewed/interpreted by me - chem normal.  CT reviewed/interpreted by me - no hem.   MRI pending.   Neurochecks/repeat exam - no change from initial.   MRI reviewed/interpreted by me - +right cva.   Discussed w neurology - they indicate outside window for tpa or other acute intervention.   CRITICAL CARE RE: acute CVA, code stroke activation.  Performed by: Suzi Roots Total critical care time: 45 minutes Critical care time was exclusive of separately billable procedures and treating other patients. Critical care was necessary to treat or prevent imminent or life-threatening deterioration. Critical care was time spent personally by me on the following activities: development of treatment plan with patient and/or surrogate as well as nursing, discussions with consultants, evaluation of patient's response to treatment, examination of patient, obtaining history from patient or surrogate, ordering and performing treatments and interventions, ordering and review of laboratory studies, ordering and review of radiographic studies, pulse oximetry and re-evaluation of patient's condition.  Medicine consulted for admission.    Final Clinical Impression(s) / ED Diagnoses Final diagnoses:  None    Rx / DC Orders ED Discharge Orders    None       Cathren Laine, MD 12/28/19 1657

## 2019-12-28 NOTE — ED Notes (Signed)
Olivia RN at bedside at this time

## 2019-12-28 NOTE — H&P (Addendum)
Family Medicine Teaching [facility] Admission History and Physical Service Pager: [phone]  Patient name: Antonio Santos Medical record number: [id number] Date of birth: [birth year] Age: 44 y.o. Gender: male  Primary Care Provider: Salli Real, MD Consultants: Neuro Code Status: Full  Chief Complaint: Left sided Weakness  Assessment and Plan: Antonio Santos is a 44 y.o. 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 No significant PMHx.    Right Acute Infarct of the Inferior Medial Thalamus: Had new onset vision changes at 645 AM this morning and went to [facility] and work up, including MRI, at that time was negative. Later experienced vision changes and left sided weakness at the ophthalmologists office.   Presented to [facility] where MRI revealed a new inferior medial right thalamus infarct. Pt at this point outside the tPA window. Strength already starting to return, but still has some dysarthria and facial drop. Passed bedside swallow test. -Admit to Med-Surg, Dr. Pollie Meyer attending -Neuro following, appreciate recs -BP goal permissive HTN < 220/120 mmHg -Will start ASA 325 mg daily -Will start Atorvastatin 80 mg or similar -A1C and Lipid panel ordered -CMP and CBC ordered -Hypercoag panel ordered -TTE -PT/OT eval - encourage full tobacco cessation and weight loss  - Continuous cardiac monitoring   AKI: On admission has Creatinine of 1.37. no previous values to compare to, therefore unable to determine what degree of this is chronic, if any.   -Continue to monitor - AM bmp  HLD Pt prescribed fenofibrate by his pcp at some point in the past.  No previous lipid panels available in EHR. Pt states he has never been on a statin.   - starting statin for secondary prevention of stroke.    Chronic Back Pain: Larey Seat down stair approximately 1 year ago.  Takes Oxycodone and muscle relaxer. Once med rec  completed can consider restarting muscle relaxer. -Start Oxycodone 5-325 q6 PRN severe pain -Can restart muscle relaxer when appropriate   FEN/GI: Regular Diet/ Miralax Prophylaxis: SCD's  Disposition: Med-Surg  History of Present Illness:  Antonio Santos is a 44 y.o. male presenting with   Patient was driving to [place] for work this morning and experienced tunnel vision followed by double vision. He pulled over and let his friend drive the rest of the way to work.  At his office he felt better but was convinced to go to [facility] hospital for workup. Workup was negative for stroke.  he then went home and then to the ophthomologist office.  While he was at the office he 'looked like he was following asleep' while getting his eyes tested. His wife then noticed facial droop on left side and dysarthria. This progressed to full left sided weakness.  He was then taken to [facility] for further workup.    Prior to this the patient has experienced 2-3 weeks of left leg pain and 'tightness'.  No swelling.  No dyspnea.    Pt fell down some stairs a year ago.  He now takes oxycodone 3-4 times a day and flexeril for chronic back, shoulder, knee pain.  He also takes trazadone PRN for sleep.    PMH: high cholesterol. Takes fenofibrate.  He does not think he's ever tried a statin. .    FH: dad had 'an aneurysm' when he was 53.  Mom has diabetes and a1at deficiency.   Drinks alcohol 2-3 days a week.  2-3 hard seltzers.  Chews  tobacco for th past few year.  Smoked cigarettes for 7 years. Smoked 1-2 ppd.  Quit in 2015.  No drug use.    Review Of Systems: Per HPI with the following additions:   Review of Systems  HENT: Negative for hearing loss.   Eyes: Positive for blurred vision and double vision.  Respiratory: Negative for shortness of breath.   Cardiovascular: Negative for chest pain.  Gastrointestinal: Negative for abdominal pain, diarrhea, nausea and vomiting.  Genitourinary: Negative for  dysuria and frequency.  Musculoskeletal: Positive for back pain and joint pain.  Neurological: Negative for headaches.    Patient Active Problem List   Diagnosis Date Noted  . CVA (cerebral vascular accident) (HCC) 12/28/2019    Past Medical History: No past medical history on file.  Past Surgical History: No past surgical history on file.  Social History: Social History   Tobacco Use  . Smoking status: Not on file  Substance Use Topics  . Alcohol use: Not on file  . Drug use: Not on file   Additional social history: See HPI Please also refer to relevant sections of EMR.  Family History: No family history on file. See HPI (If not completed, MUST add something in)  Allergies and Medications: Allergies  Allergen Reactions  . Penicillins Other (See Comments)    Occurred when he was a child   No current facility-administered medications on file prior to encounter.   Current Outpatient Medications on File Prior to Encounter  Medication Sig Dispense Refill  . fenofibrate micronized (LOFIBRA) 200 MG capsule Take 200 mg by mouth daily.     Marland Kitchen oxyCODONE-acetaminophen (PERCOCET/ROXICET) 5-325 MG tablet Take 1 tablet by mouth 4 (four) times daily as needed.    . cyclobenzaprine (FLEXERIL) 10 MG tablet Take 10 mg by mouth 3 (three) times daily as needed. (Patient not taking: Reported on 12/28/2019)    . traZODone (DESYREL) 100 MG tablet Take 100 mg by mouth at bedtime. (Patient not taking: Reported on 12/28/2019)      Objective: BP 138/80   Pulse 88   Resp 18   Ht 6' (1.829 m)   Wt 130.4 kg   SpO2 98%   BMI 38.99 kg/m  Physical Exam Vitals and nursing note reviewed.  Constitutional:      General: He is not in acute distress.    Appearance: Normal appearance. He is obese. He is not ill-appearing, toxic-appearing or diaphoretic.  HENT:     Head: Normocephalic and atraumatic.  Cardiovascular:     Rate and Rhythm: Normal rate and regular rhythm.     Pulses: Normal  pulses.          Radial pulses are 2+ on the right side and 2+ on the left side.       Dorsalis pedis pulses are 2+ on the right side and 2+ on the left side.     Heart sounds: Normal heart sounds, S1 normal and S2 normal.  Pulmonary:     Effort: Pulmonary effort is normal. No respiratory distress.     Breath sounds: Normal breath sounds. No wheezing.  Abdominal:     General: There is no distension.     Palpations: There is no mass.     Tenderness: There is no abdominal tenderness.  Musculoskeletal:     Right lower leg: No edema.     Left lower leg: No edema.  Neurological:     Mental Status: He is alert.     Comments: Strength: Right Upper  and Lower extremity 5/5 Left Upper Extremity 3/5 Left Lower Extremity 4/5 Sensation intact bilaterally upper and lower extremity  Heel to Shin intact  CN II- XII grossly intact except CN VII Left sided facial drop      Labs and Imaging: CBC BMET  Recent Labs  Lab 12/28/19 1455 12/28/19 1455 12/28/19 1504  WBC 7.6  --   --   HGB 13.9   < > 13.6  HCT 41.2   < > 40.0  PLT 211  --   --    < > = values in this interval not displayed.   Recent Labs  Lab 12/28/19 1455 12/28/19 1455 12/28/19 1504  NA 138   < > 139  K 3.9   < > 3.8  CL 103   < > 103  CO2 25  --   --   BUN 15   < > 16  CREATININE 1.37*   < > 1.30*  GLUCOSE 119*   < > 115*  CALCIUM 10.0  --   --    < > = values in this interval not displayed.      CT Cerebral Perfusion W Contrast: No large vessel occlusion or hemodynamically significant stenosis. No evidence of core infarction or penumbra on perfusion imaging.   CT Angio Neck Code Stroke 10/21 4 PM: See below   CT Angio Head Code Stroke 10/21 4 PM: No large vessel occlusion or hemodynamically significant stenosis. No evidence of core infarction or penumbra on perfusion imaging.   CT Head Code Stroke WO Contrast 10/21 3 PM: No acute intracranial hemorrhage. Possible small area of acute infarction in the  inferior medial right thalamus, noting corresponding mild diffusion hyperintensity on the prior MRI   MR Brain W WO Contrast 10/21 12 PM: See below   MR Angio Head WO Contrast 10/21 12 PM: No evidence of acute intracranial abnormality. Specifically, no acute infarct. No hemodynamically significant stenosis or proximal large vessel occlusion.   CT Head Code Stroke 10/21 9 AM: No acute intracranial process. ASPECTS is 10   Arna Snipe,  12/28/2019, 9:45 PM PGY-1, [facility] Family Medicine FPTS Intern pager: [phone], text pages welcome  Resident Addendum I have separately seen and examined the patient.  I have discussed the findings and exam with the resident and agree with the above note.  I helped develop the management plan that is described in the resident's note and I agree with the content.  Changes have been made in BLUE.    Lenor Coffin, MD PGY-3 [facility] residency program
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
## 2019-12-28 NOTE — ED Notes (Signed)
Pt to MRI at this time.

## 2019-12-28 NOTE — ED Notes (Signed)
Pt verbalized understanding of d/c instructions and has no questions at this time   E-sign not working

## 2019-12-28 NOTE — Progress Notes (Signed)
   12/28/19 0900  Clinical Encounter Type  Visited With Patient and family together  Visit Type Initial  Referral From Nurse  Consult/Referral To Chaplain  Responded to PG for code stroke in the ED. I stood outside the room until the Dr talked to Pt and family. I enter the room and talk to Pt and wife. I let them know if they need me later to just let the nurse know and I will come back and see them.

## 2019-12-28 NOTE — ED Notes (Signed)
CODE STROKE CALLED TO [id number] 

## 2019-12-28 NOTE — ED Notes (Signed)
Pt agitated during MRI, ativan given.

## 2019-12-28 NOTE — ED Notes (Addendum)
Neurology MD Lindzen at bedside at this time

## 2019-12-28 NOTE — ED Notes (Addendum)
EDP Goodman at bedside at this time. Pt states double vision at this time

## 2019-12-29 ENCOUNTER — Inpatient Hospital Stay (HOSPITAL_COMMUNITY): Payer: 59

## 2019-12-29 ENCOUNTER — Encounter (HOSPITAL_COMMUNITY): Payer: Self-pay | Admitting: Student in an Organized Health Care Education/Training Program

## 2019-12-29 DIAGNOSIS — I639 Cerebral infarction, unspecified: Secondary | ICD-10-CM | POA: Diagnosis not present

## 2019-12-29 DIAGNOSIS — I351 Nonrheumatic aortic (valve) insufficiency: Secondary | ICD-10-CM

## 2019-12-29 DIAGNOSIS — I6389 Other cerebral infarction: Secondary | ICD-10-CM

## 2019-12-29 DIAGNOSIS — I63331 Cerebral infarction due to thrombosis of right posterior cerebral artery: Secondary | ICD-10-CM | POA: Diagnosis not present

## 2019-12-29 DIAGNOSIS — G4733 Obstructive sleep apnea (adult) (pediatric): Secondary | ICD-10-CM

## 2019-12-29 LAB — ECHOCARDIOGRAM COMPLETE
Area-P 1/2: 3.21 cm2
Height: 72 in
S' Lateral: 3.37 cm
Weight: 4599.68 oz

## 2019-12-29 LAB — CBC
HCT: 40.7 % (ref 39.0–52.0)
Hemoglobin: 13.6 g/dL (ref 13.0–17.0)
MCH: 30.4 pg (ref 26.0–34.0)
MCHC: 33.4 g/dL (ref 30.0–36.0)
MCV: 91.1 fL (ref 80.0–100.0)
Platelets: 206 10*3/uL (ref 150–400)
RBC: 4.47 MIL/uL (ref 4.22–5.81)
RDW: 12.2 % (ref 11.5–15.5)
WBC: 7.9 10*3/uL (ref 4.0–10.5)
nRBC: 0 % (ref 0.0–0.2)

## 2019-12-29 LAB — COMPREHENSIVE METABOLIC PANEL
ALT: 28 U/L (ref 0–44)
AST: 25 U/L (ref 15–41)
Albumin: 4.1 g/dL (ref 3.5–5.0)
Alkaline Phosphatase: 37 U/L — ABNORMAL LOW (ref 38–126)
Anion gap: 12 (ref 5–15)
BUN: 15 mg/dL (ref 6–20)
CO2: 24 mmol/L (ref 22–32)
Calcium: 9.7 mg/dL (ref 8.9–10.3)
Chloride: 104 mmol/L (ref 98–111)
Creatinine, Ser: 1.36 mg/dL — ABNORMAL HIGH (ref 0.61–1.24)
GFR, Estimated: >60 mL/min (ref >60)
Glucose, Bld: 107 mg/dL — ABNORMAL HIGH (ref 70–99)
Potassium: 3.6 mmol/L (ref 3.5–5.1)
Sodium: 140 mmol/L (ref 135–145)
Total Bilirubin: 0.7 mg/dL (ref 0.3–1.2)
Total Protein: 6.9 g/dL (ref 6.5–8.1)

## 2019-12-29 LAB — PROTEIN S ACTIVITY: Protein S Activity: 113 % (ref 63–140)

## 2019-12-29 LAB — LUPUS ANTICOAGULANT PANEL
DRVVT: 38 s (ref 0.0–47.0)
PTT Lupus Anticoagulant: 28 s (ref 0.0–51.9)

## 2019-12-29 LAB — PROTIME-INR
INR: 1 (ref 0.8–1.2)
Prothrombin Time: 12.7 seconds (ref 11.4–15.2)

## 2019-12-29 LAB — LIPID PANEL
Cholesterol: 218 mg/dL — ABNORMAL HIGH (ref 0–200)
HDL: 47 mg/dL (ref >40)
LDL Cholesterol: 114 mg/dL — ABNORMAL HIGH (ref 0–99)
Total CHOL/HDL Ratio: 4.6 RATIO
Triglycerides: 286 mg/dL — ABNORMAL HIGH (ref <150)
VLDL: 57 mg/dL — ABNORMAL HIGH (ref 0–40)

## 2019-12-29 LAB — RAPID URINE DRUG SCREEN, HOSP PERFORMED
Amphetamines: NOT DETECTED
Barbiturates: NOT DETECTED
Benzodiazepines: NOT DETECTED
Cocaine: NOT DETECTED
Opiates: NOT DETECTED
Tetrahydrocannabinol: NOT DETECTED

## 2019-12-29 LAB — PROTEIN C ACTIVITY: Protein C Activity: 187 % — ABNORMAL HIGH (ref 73–180)

## 2019-12-29 LAB — HOMOCYSTEINE: Homocysteine: 25.1 umol/L — ABNORMAL HIGH (ref 0.0–14.5)

## 2019-12-29 LAB — PROTEIN S, TOTAL: Protein S Ag, Total: 100 % (ref 60–150)

## 2019-12-29 MED ORDER — ATORVASTATIN CALCIUM 80 MG PO TABS
80.0000 mg | ORAL_TABLET | Freq: Every day | ORAL | Status: DC
  Administered 2019-12-29: 80 mg via ORAL
  Filled 2019-12-29: qty 1

## 2019-12-29 MED ORDER — ATORVASTATIN CALCIUM 40 MG PO TABS
40.0000 mg | ORAL_TABLET | Freq: Every day | ORAL | Status: DC
  Administered 2019-12-30: 40 mg via ORAL
  Filled 2019-12-29: qty 1

## 2019-12-29 NOTE — ED Notes (Signed)
Lunch Tray Ordered @ 1020.  

## 2019-12-29 NOTE — ED Notes (Signed)
Pt resting in bed. NADN 

## 2019-12-29 NOTE — Progress Notes (Signed)
STROKE TEAM PROGRESS NOTE   INTERVAL HISTORY Wife at the bedside. Pt lying in bed, AAO x 3 and recounted HPI with me. He is pretty much back to his baseline. They also complained of snoring at night, drowsiness during the day, and morning HAs. I feel he has significant symptoms of OSA. Will need timely follow up and management.  Vitals:   12/29/19 0630 12/29/19 0700 12/29/19 0715 12/29/19 0815  BP: (!) 147/69 (!) 160/136 134/78   Pulse: 80 84 80 80  Resp:      SpO2: 96% 98% 96% 95%  Weight:      Height:       CBC:  Recent Labs  Lab 12/28/19 0907 12/28/19 0907 12/28/19 1455 12/28/19 1455 12/28/19 1504 12/29/19 0217  WBC 8.0   < > 7.6  --   --  7.9  NEUTROABS 5.9  --  5.4  --   --   --   HGB 14.0   < > 13.9   < > 13.6 13.6  HCT 40.2   < > 41.2   < > 40.0 40.7  MCV 88.5   < > 90.0  --   --  91.1  PLT 215   < > 211  --   --  206   < > = values in this interval not displayed.   Basic Metabolic Panel:  Recent Labs  Lab 12/28/19 1455 12/28/19 1455 12/28/19 1504 12/29/19 0217  NA 138   < > 139 140  K 3.9   < > 3.8 3.6  CL 103   < > 103 104  CO2 25  --   --  24  GLUCOSE 119*   < > 115* 107*  BUN 15   < > 16 15  CREATININE 1.37*   < > 1.30* 1.36*  CALCIUM 10.0  --   --  9.7   < > = values in this interval not displayed.   Lipid Panel:  Recent Labs  Lab 12/29/19 0217  CHOL 218*  TRIG 286*  HDL 47  CHOLHDL 4.6  VLDL 57*  LDLCALC 114*   HgbA1c: No results for input(s): HGBA1C in the last 168 hours. Urine Drug Screen:  Recent Labs  Lab 12/29/19 0715  LABOPIA NONE DETECTED  COCAINSCRNUR NONE DETECTED  LABBENZ NONE DETECTED  AMPHETMU NONE DETECTED  THCU NONE DETECTED  LABBARB NONE DETECTED    Alcohol Level No results for input(s): ETH in the last 168 hours.  IMAGING past 24 hours MR ANGIO HEAD WO CONTRAST  Result Date: 12/28/2019 CLINICAL DATA:  Neuro deficit, acute stroke suspected. Acute double vision. EXAM: MRI HEAD WITHOUT AND WITH CONTRAST MRA HEAD  WITHOUT CONTRAST TECHNIQUE: Multiplanar, multiecho pulse sequences of the brain and surrounding structures were obtained without and with intravenous contrast. Angiographic images of the Circle of Willis were obtained using MRA technique without intravenous contrast. CONTRAST:  99mL GADAVIST GADOBUTROL 1 MMOL/ML IV SOLN COMPARISON:  CT head from the same day FINDINGS: MRI HEAD FINDINGS Brain: No acute infarction, hemorrhage, hydrocephalus, extra-axial collection or mass lesion. Apparent DWI hyperintensity in the medial right thalamus is favored artifactual given apparent linear artifact through this region. No abnormal enhancement. Skull and upper cervical spine: Normal marrow signal. Sinuses/Orbits: Mild scattered ethmoid air cell mucosal thickening. Unremarkable orbits. Other: No mastoid effusions. MRA HEAD FINDINGS No evidence of significant (greater than 50%) stenosis or proximal large vessel occlusion. No aneurysm. Diminutive right vertebral artery (likely non dominant) which appears to terminate as  PICA. Bilateral posterior communicating arteries. IMPRESSION: 1. No evidence of acute intracranial abnormality. Specifically, no acute infarct. 2. No hemodynamically significant stenosis or proximal large vessel occlusion. Electronically Signed   By: Feliberto HartsFrederick S Jones MD   On: 12/28/2019 11:49   MR BRAIN WO CONTRAST  Result Date: 12/28/2019 CLINICAL DATA:  Follow-up examination for acute stroke. EXAM: MRI HEAD WITHOUT CONTRAST TECHNIQUE: Multiplanar, multiecho pulse sequences of the brain and surrounding structures were obtained without intravenous contrast. COMPARISON:  Prior CTA from earlier the same day. FINDINGS: Brain: Examination moderately degraded by motion artifact. Cerebral volume within normal limits for age. No significant cerebral white matter disease. 11 mm acute ischemic infarct seen involving the medial right thalamus (series 3, image 26). Additionally, there is a subtle patchy 4 mm ischemic  infarct seen involving the contralateral medial left thalamus, only seen on coronal DWI sequence (series 6, image 20). No associated hemorrhage or mass effect. Finding raises the suspicion for an artery of [place] infarct. No other diffusion abnormality to suggest acute or subacute ischemia. Gray-white matter differentiation otherwise maintained. No encephalomalacia to suggest chronic cortical infarction elsewhere within the brain. No visible foci of susceptibility artifact to suggest acute or chronic intracranial hemorrhage. No mass lesion, midline shift or mass effect. No hydrocephalus or extra-axial fluid collection. Incidental note made of a partially empty sella. Suprasellar region normal. Midline structures intact. Vascular: Major intracranial vascular flow voids are maintained. No findings to suggest dural sinus thrombosis of the deep cerebral veins. Skull and upper cervical spine: Craniocervical junction within normal limits. Upper cervical spine normal. Bone marrow signal intensity within normal limits. No scalp soft tissue abnormality. Sinuses/Orbits: Globes and orbital soft tissues within normal limits. Mild scattered mucosal thickening noted within the ethmoidal air cells. Paranasal sinuses are otherwise clear. No mastoid effusion. Inner ear structures grossly normal. Other: None. IMPRESSION: 1. 11 mm acute ischemic nonhemorrhagic medial right thalamic infarct. 2. Additional subtle 4 mm ischemic nonhemorrhagic infarct involving the contralateral medial left thalamus. Constellation of findings suspicious for an artery of [place]heron infarction. 3. No other acute intracranial abnormality. Electronically Signed   By: Rise MuBenjamin  McClintock M.D.   On: 12/28/2019 16:44   MR BRAIN W WO CONTRAST  Result Date: 12/28/2019 CLINICAL DATA:  Neuro deficit, acute stroke suspected. Acute double vision. EXAM: MRI HEAD WITHOUT AND WITH CONTRAST MRA HEAD WITHOUT CONTRAST TECHNIQUE: Multiplanar, multiecho pulse  sequences of the brain and surrounding structures were obtained without and with intravenous contrast. Angiographic images of the Circle of Willis were obtained using MRA technique without intravenous contrast. CONTRAST:  10mL GADAVIST GADOBUTROL 1 MMOL/ML IV SOLN COMPARISON:  CT head from the same day FINDINGS: MRI HEAD FINDINGS Brain: No acute infarction, hemorrhage, hydrocephalus, extra-axial collection or mass lesion. Apparent DWI hyperintensity in the medial right thalamus is favored artifactual given apparent linear artifact through this region. No abnormal enhancement. Skull and upper cervical spine: Normal marrow signal. Sinuses/Orbits: Mild scattered ethmoid air cell mucosal thickening. Unremarkable orbits. Other: No mastoid effusions. MRA HEAD FINDINGS No evidence of significant (greater than 50%) stenosis or proximal large vessel occlusion. No aneurysm. Diminutive right vertebral artery (likely non dominant) which appears to terminate as PICA. Bilateral posterior communicating arteries. IMPRESSION: 1. No evidence of acute intracranial abnormality. Specifically, no acute infarct. 2. No hemodynamically significant stenosis or proximal large vessel occlusion. Electronically Signed   By: Feliberto HartsFrederick S Jones MD   On: 12/28/2019 11:49   CT CEREBRAL PERFUSION W CONTRAST  Result Date: 12/28/2019 CLINICAL DATA:  Left-sided facial  droop EXAM: CT ANGIOGRAPHY HEAD AND NECK CT PERFUSION BRAIN TECHNIQUE: Multidetector CT imaging of the head and neck was performed using the standard protocol during bolus administration of intravenous contrast. Multiplanar CT image reconstructions and MIPs were obtained to evaluate the vascular anatomy. Carotid stenosis measurements (when applicable) are obtained utilizing NASCET criteria, using the distal internal carotid diameter as the denominator. Multiphase CT imaging of the brain was performed following IV bolus contrast injection. Subsequent parametric perfusion maps were  calculated using RAPID software. CONTRAST:  OMNIPAQUE IOHEXOL 350 MG/ML SOLN COMPARISON:  None. FINDINGS: CTA NECK FINDINGS Aortic arch: Great vessel origins are patent. Right carotid system: Patent. No measurable stenosis at the ICA origin. Left carotid system: Patent. No measurable stenosis at the ICA origin. Vertebral arteries: Patent.  Left vertebral artery is dominant. Skeleton: Unremarkable. Other neck: No mass or adenopathy. Upper chest: No apical lung mass. Review of the MIP images confirms the above findings CTA HEAD FINDINGS Anterior circulation: Intracranial internal carotid arteries are patent. Anterior and middle cerebral arteries are patent. Posterior circulation: Intracranial vertebral arteries are patent. Right vertebral artery terminates as a PICA. Basilar artery is patent. PICA, AICA, and superior cerebellar artery origins are patent. Posterior cerebral arteries are patent. There are bilateral posterior communicating arteries present. Venous sinuses: As permitted by contrast timing, patent. Review of the MIP images confirms the above findings CT Brain Perfusion Findings: CBF (<30%) Volume: 0mL Perfusion (Tmax>6.0s) volume: 5mL, which is likely artifactual Mismatch Volume: 5mL, which is likely artifactual Infarction Location: None IMPRESSION: No large vessel occlusion or hemodynamically significant stenosis. No evidence of core infarction or penumbra on perfusion imaging. Findings were discussed with Dr. Wilford Corner at the time of study completion. Electronically Signed   By: Guadlupe Spanish M.D.   On: 12/28/2019 15:51   CT HEAD CODE STROKE WO CONTRAST  Result Date: 12/28/2019 CLINICAL DATA:  Code stroke.  Left facial droop and arm weakness EXAM: CT HEAD WITHOUT CONTRAST TECHNIQUE: Contiguous axial images were obtained from the base of the skull through the vertex without intravenous contrast. COMPARISON:  Earlier same day FINDINGS: Brain: No acute intracranial hemorrhage, mass effect, or edema.  No new loss of gray-white differentiation. Possible small area hypoattenuation in the inferior medial right thalamus (series 3, image 17). There is corresponding mild diffusion hyperintensity on the prior MRI. Ventricles are stable in size. Vascular: No new hyperdense vessel. Skull: Unremarkable. Sinuses/Orbits: No acute abnormality. Other: None. ASPECTS [facility] Stroke Program Early CT Score) - Ganglionic level infarction (caudate, lentiform nuclei, internal capsule, insula, M1-M3 cortex): 7 - Supraganglionic infarction (M4-M6 cortex): 3 Total score (0-10 with 10 being normal): 10 IMPRESSION: No acute intracranial hemorrhage. Possible small area of acute infarction in the inferior medial right thalamus, noting corresponding mild diffusion hyperintensity on the prior MRI These results were communicated to Dr. Wilford Corner at 3:05 pm on 12/28/2019 by text page via the [facility] messaging system. Electronically Signed   By: Guadlupe Spanish M.D.   On: 12/28/2019 15:15   CT ANGIO HEAD CODE STROKE  Result Date: 12/28/2019 CLINICAL DATA:  Left-sided facial droop EXAM: CT ANGIOGRAPHY HEAD AND NECK CT PERFUSION BRAIN TECHNIQUE: Multidetector CT imaging of the head and neck was performed using the standard protocol during bolus administration of intravenous contrast. Multiplanar CT image reconstructions and MIPs were obtained to evaluate the vascular anatomy. Carotid stenosis measurements (when applicable) are obtained utilizing NASCET criteria, using the distal internal carotid diameter as the denominator. Multiphase CT imaging of the brain was performed following  IV bolus contrast injection. Subsequent parametric perfusion maps were calculated using RAPID software. CONTRAST:  OMNIPAQUE IOHEXOL 350 MG/ML SOLN COMPARISON:  None. FINDINGS: CTA NECK FINDINGS Aortic arch: Great vessel origins are patent. Right carotid system: Patent. No measurable stenosis at the ICA origin. Left carotid system: Patent. No measurable stenosis at  the ICA origin. Vertebral arteries: Patent.  Left vertebral artery is dominant. Skeleton: Unremarkable. Other neck: No mass or adenopathy. Upper chest: No apical lung mass. Review of the MIP images confirms the above findings CTA HEAD FINDINGS Anterior circulation: Intracranial internal carotid arteries are patent. Anterior and middle cerebral arteries are patent. Posterior circulation: Intracranial vertebral arteries are patent. Right vertebral artery terminates as a PICA. Basilar artery is patent. PICA, AICA, and superior cerebellar artery origins are patent. Posterior cerebral arteries are patent. There are bilateral posterior communicating arteries present. Venous sinuses: As permitted by contrast timing, patent. Review of the MIP images confirms the above findings CT Brain Perfusion Findings: CBF (<30%) Volume: 22mL Perfusion (Tmax>6.0s) volume: 63mL, which is likely artifactual Mismatch Volume: 41mL, which is likely artifactual Infarction Location: None IMPRESSION: No large vessel occlusion or hemodynamically significant stenosis. No evidence of core infarction or penumbra on perfusion imaging. Findings were discussed with Dr. Wilford Corner at the time of study completion. Electronically Signed   By: Guadlupe Spanish M.D.   On: 12/28/2019 15:51   CT ANGIO NECK CODE STROKE  Result Date: 12/28/2019 CLINICAL DATA:  Left-sided facial droop EXAM: CT ANGIOGRAPHY HEAD AND NECK CT PERFUSION BRAIN TECHNIQUE: Multidetector CT imaging of the head and neck was performed using the standard protocol during bolus administration of intravenous contrast. Multiplanar CT image reconstructions and MIPs were obtained to evaluate the vascular anatomy. Carotid stenosis measurements (when applicable) are obtained utilizing NASCET criteria, using the distal internal carotid diameter as the denominator. Multiphase CT imaging of the brain was performed following IV bolus contrast injection. Subsequent parametric perfusion maps were calculated  using RAPID software. CONTRAST:  OMNIPAQUE IOHEXOL 350 MG/ML SOLN COMPARISON:  None. FINDINGS: CTA NECK FINDINGS Aortic arch: Great vessel origins are patent. Right carotid system: Patent. No measurable stenosis at the ICA origin. Left carotid system: Patent. No measurable stenosis at the ICA origin. Vertebral arteries: Patent.  Left vertebral artery is dominant. Skeleton: Unremarkable. Other neck: No mass or adenopathy. Upper chest: No apical lung mass. Review of the MIP images confirms the above findings CTA HEAD FINDINGS Anterior circulation: Intracranial internal carotid arteries are patent. Anterior and middle cerebral arteries are patent. Posterior circulation: Intracranial vertebral arteries are patent. Right vertebral artery terminates as a PICA. Basilar artery is patent. PICA, AICA, and superior cerebellar artery origins are patent. Posterior cerebral arteries are patent. There are bilateral posterior communicating arteries present. Venous sinuses: As permitted by contrast timing, patent. Review of the MIP images confirms the above findings CT Brain Perfusion Findings: CBF (<30%) Volume: 66mL Perfusion (Tmax>6.0s) volume: 75mL, which is likely artifactual Mismatch Volume: 39mL, which is likely artifactual Infarction Location: None IMPRESSION: No large vessel occlusion or hemodynamically significant stenosis. No evidence of core infarction or penumbra on perfusion imaging. Findings were discussed with Dr. Wilford Corner at the time of study completion. Electronically Signed   By: Guadlupe Spanish M.D.   On: 12/28/2019 15:51    PHYSICAL EXAM  Pulse Rate:  [63-94] 94 (10/22 1027) Resp:  [10-21] 21 (10/22 1027) BP: (115-162)/(49-136) 142/57 (10/22 1027) SpO2:  [90 %-100 %] 96 % (10/22 1027) Weight:  [130.4 kg] 130.4 kg (10/21 1530)  General - Well nourished, well developed, in no apparent distress.  Ophthalmologic - fundi not visualized due to noncooperation.  Cardiovascular - Regular rhythm and  rate.  Mental Status -  Level of arousal and orientation to time, place, and person were intact. Language including expression, naming, repetition, comprehension was assessed and found intact. Attention span and concentration were normal. Fund of Knowledge was assessed and was intact.  Cranial Nerves II - XII - II - Visual field intact OU. III, IV, VI - Extraocular movements intact. V - Facial sensation intact bilaterally. VII - Facial movement intact bilaterally. VIII - Hearing & vestibular intact bilaterally. X - Palate elevates symmetrically. XI - Chin turning & shoulder shrug intact bilaterally. XII - Tongue protrusion intact.  Motor Strength - The patient's strength was normal in all extremities and pronator drift was absent.  Bulk was normal and fasciculations were absent.   Motor Tone - Muscle tone was assessed at the neck and appendages and was normal.  Reflexes - The patient's reflexes were symmetrical in all extremities and he had no pathological reflexes.  Sensory - Light touch, temperature/pinprick were assessed and were symmetrical.    Coordination - The patient had normal movements in the hands and feet with no ataxia or dysmetria.  Tremor was absent.  Gait and Station - deferred.   ASSESSMENT/PLAN [name] is a 45 y.o. male with history of obesity, back pain, hypercholesterolemia, seen 10/21 at [facility] for visual disturbance, concerning for posterior circulation stroke with reported negative MRI and discharge to follow-up with ophthalmology. At the opthamologist's office he developed R gaze, flacced LUE, LLE weakness and L facial droop. He then presented via EMS to [facility] ED for evaluation. Not a tPA candidate as earlier sx never resolved. No LVO.    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 Possible inferior medial R thalamic hypodensity. ASPECTS 10.     CTA head & neck no LVO  CT perfusion no core infarct or penumbra  MRI  Medial R thalamic infarct. Contralateral punctate medial L thalamic infarct.  2D Echo pending  LDL 114  HgbA1c pending   VTE prophylaxis - SCDs   No antithrombotic prior to admission, now on aspirin 81 mg daily and clopidogrel 75 mg daily. Continue DAPT x 3 weeks then aspirin alone  Therapy recommendations:  pending   Disposition:  pending   Hypertension  Stable . Permissive hypertension (OK if < 220/120) but gradually normalize in 2-3 days . Long-term BP goal normotensive  Hyperlipidemia  Home meds:  No statin  LDL 114, goal < 70  Now on lipitor 40  Continue statin at discharge  Likely OSA  Pt and wife described typical OSA symptoms - snoring, daytime drowsiness, morning HA  Will need setup with sleep specialist ASAP  Other Stroke Risk Factors  Obesity, Body mass index is 38.99 kg/m., recommend weight loss, diet and exercise as appropriate   Other Active Problems  AKI  Chronic back pain  Hospital day # 1  Neurology will sign off. Please call with questions. Pt will follow up with stroke clinic NP at [facility] in about 4 weeks. Thanks for the consult.  Marvel Plan, MD PhD Stroke Neurology 12/29/2019 1:47 PM   To contact Stroke Continuity provider, please refer to [email or web address]. After hours, contact General Neurology

## 2019-12-29 NOTE — Hospital Course (Signed)
Follow up with ophtho -?prism glasses

## 2019-12-29 NOTE — ED Notes (Signed)
Report to [facility]

## 2019-12-29 NOTE — Progress Notes (Signed)
Family Medicine Teaching Service Daily Progress Note Intern Pager: [phone]  Patient name: Antonio Santos Medical record number: [id number] Date of birth: 1975/04/16 Age: 44 y.o. Gender: male  Primary Care Provider: Salli Real, MD Consultants: Neuro Code Status: Full  Pt Overview and Major Events to Date:  10/21 Admitted  Assessment and Plan:  Antonio Santos is a 44 y.o. 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 No significant PMHx.    Right Acute Infarct of the Inferior Medial Thalamus: Presented to [facility] where MRI revealed a new inferior medial right thalamus infarct.  -Neuro following, appreciate recs -BP goal permissive HTN < 220/120 mmHg -Continue ASA 81 mg daily -Continue Atorvastatin 80 mg -A1C and Lipid panel ordered -CMP and CBC ordered -Hypercoag panel ordered -TTE -PT/OT eval - encourage full tobacco cessation and weight loss  -Continuous cardiac monitoring   AKI On Unknown Baseline: On admission has Creatinine of 1.37. no previous values to compare to, therefore unable to determine what degree of this is chronic, if any. Today Creatinine was 1.36. -Continue to trend   HLD Pt prescribed fenofibrate by his pcp at some point in the past.  No previous lipid panels available in EHR. Pt states he has never been on a statin.   -Continue Atorvastatin 80 mg daily     Chronic Back Pain: Fell down stair approximately 1 year ago.  Takes Oxycodone and muscle relaxer. Once med rec completed can consider restarting muscle relaxer. -Start Oxycodone 5-325 q6 PRN severe pain -Can restart muscle relaxer when appropriate   FEN/GI: Regular Diet/ Miralax PPx: SCD's  Disposition: Med-Surg  Prior to Admission Living Arrangement: Home Anticipated Discharge Location: Home Barriers to Discharge: Medical Workup Anticipated discharge in approximately 1-3 day(s).   Subjective:   Interviewed patient at bedside in the ED.  He reports that he feels almost back to baseline. States that he no longer sees double and was able to read the clock on the wall only the numbers were slightly blurry. Discussed with patient and wife that to help prevent future strokes will start 81 mg Aspirin, Atorvastatin, should begin making cardio exercise a regular part of his life, attempt to lose weight, stop chewing tobacco, and as able eat a heart healthy diet. He had no other complaints at this time.  Objective: Temp:  [98.9 F (37.2 C)] 98.9 F (37.2 C) (10/21 0853) Pulse Rate:  [63-95] 78 (10/22 0530) Resp:  [10-22] 16 (10/22 0530) BP: (115-165)/(49-88) 147/77 (10/22 0530) SpO2:  [90 %-100 %] 99 % (10/22 0530) Weight:  [285 lb (129.3 kg)-287 lb 7.7 oz (130.4 kg)] 287 lb 7.7 oz (130.4 kg) (10/21 1530) Physical Exam:  Physical Exam Vitals and nursing note reviewed.  Constitutional:      General: He is not in acute distress.    Appearance: He is obese. He is not ill-appearing, toxic-appearing or diaphoretic.  HENT:     Head: Normocephalic and atraumatic.  Cardiovascular:     Rate and Rhythm: Normal rate and regular rhythm.     Pulses: Normal pulses.          Radial pulses are 2+ on the right side and 2+ on the left side.       Dorsalis pedis pulses are 2+ on the right side and 2+ on the left side.     Heart sounds: Normal heart sounds, S1 normal and S2 normal. No murmur heard.   Pulmonary:  Effort: Pulmonary effort is normal. No respiratory distress.     Breath sounds: Normal breath sounds. No wheezing.  Abdominal:     General: There is no distension.     Palpations: There is no mass.     Tenderness: There is no abdominal tenderness.  Musculoskeletal:     Right lower leg: No edema.     Left lower leg: No edema.  Neurological:     Mental Status: He is alert.     Comments: Strength Left side upper and Lower extremity 5- /5 close to equal of Right side Left sided Facial  drop has mostly resolved slight upper lip drop.  Psychiatric:        Mood and Affect: Mood normal.        Behavior: Behavior normal.        Thought Content: Thought content normal.      Laboratory: Recent Labs  Lab 12/28/19 0907 12/28/19 0907 12/28/19 1455 12/28/19 1504 12/29/19 0217  WBC 8.0  --  7.6  --  7.9  HGB 14.0   < > 13.9 13.6 13.6  HCT 40.2   < > 41.2 40.0 40.7  PLT 215  --  211  --  206   < > = values in this interval not displayed.   Recent Labs  Lab 12/28/19 0907 12/28/19 0907 12/28/19 1455 12/28/19 1504 12/29/19 0217  NA 139   < > 138 139 140  K 4.1   < > 3.9 3.8 3.6  CL 102   < > 103 103 104  CO2 27  --  25  --  24  BUN 17   < > 15 16 15   CREATININE 1.24   < > 1.37* 1.30* 1.36*  CALCIUM 10.3  --  10.0  --  9.7  PROT 8.3*  --  7.4  --  6.9  BILITOT 0.8  --  0.7  --  0.7  ALKPHOS 43  --  41  --  37*  ALT 31  --  30  --  28  AST 31  --  31  --  25  GLUCOSE 99   < > 119* 115* 107*   < > = values in this interval not displayed.    Lipid Panel     Component Value Date/Time   CHOL 218 (H) 12/29/2019 0217   TRIG 286 (H) 12/29/2019 0217   HDL 47 12/29/2019 0217   CHOLHDL 4.6 12/29/2019 0217   VLDL 57 (H) 12/29/2019 0217   LDLCALC 114 (H) 12/29/2019 0217     Imaging/Diagnostic Tests:   MR Brain WO Contrast: 11 mm acute ischemic nonhemorrhagic medial right thalamic infarct. Additional subtle 4 mm ischemic nonhemorrhagic infarct involving the contralateral medial left thalamus. Constellation of findings suspicious for an artery of Percheron infarction. No other acute intracranial abnormality.   CT Cerebral Perfusion W Contrast: No large vessel occlusion or hemodynamically significant stenosis. No evidence of core infarction or penumbra on perfusion imaging.   CT Angio Neck Code Stroke 10/21 4 PM: See below   CT Angio Head Code Stroke 10/21 4 PM: No large vessel occlusion or hemodynamically significant stenosis. No evidence of core  infarction or penumbra on perfusion imaging.   CT Head Code Stroke WO Contrast 10/21 3 PM: No acute intracranial hemorrhage. Possible small area of acute infarction in the inferior medial right thalamus, noting corresponding mild diffusion hyperintensity on the prior MRI   MR Brain W WO Contrast 10/21 12 PM: See below  MR Angio Head WO Contrast 10/21 12 PM: No evidence of acute intracranial abnormality. Specifically, no acute infarct. No hemodynamically significant stenosis or proximal large vessel occlusion.   CT Head Code Stroke 10/21 9 AM: No acute intracranial process. ASPECTS is 10   Lauro Franklin, MD 12/29/2019, 5:51 AM PGY-1, [facility] FPTS Intern pager: [phone], text pages welcome

## 2019-12-29 NOTE — ED Notes (Signed)
MD at bedside. 

## 2019-12-29 NOTE — ED Notes (Signed)
First contact. Change of shift. Pt resting in bed. Endorsing chronic back pain. Endorsing double vision but otherwise no neuro deficits. Will continue to monitor.

## 2019-12-30 LAB — BASIC METABOLIC PANEL
Anion gap: 10 (ref 5–15)
BUN: 18 mg/dL (ref 6–20)
CO2: 24 mmol/L (ref 22–32)
Calcium: 8.9 mg/dL (ref 8.9–10.3)
Chloride: 105 mmol/L (ref 98–111)
Creatinine, Ser: 1.35 mg/dL — ABNORMAL HIGH (ref 0.61–1.24)
GFR, Estimated: >60 mL/min (ref >60)
Glucose, Bld: 103 mg/dL — ABNORMAL HIGH (ref 70–99)
Potassium: 3.8 mmol/L (ref 3.5–5.1)
Sodium: 139 mmol/L (ref 135–145)

## 2019-12-30 LAB — BETA-2-GLYCOPROTEIN I ABS, IGG/M/A
Beta-2 Glyco I IgG: <9 GPI IgG units (ref 0–20)
Beta-2-Glycoprotein I IgA: <9 GPI IgA units (ref 0–25)
Beta-2-Glycoprotein I IgM: <9 GPI IgM units (ref 0–32)

## 2019-12-30 LAB — GLUCOSE, CAPILLARY: Glucose-Capillary: 92 mg/dL (ref 70–99)

## 2019-12-30 LAB — PROTEIN C, TOTAL: Protein C, Total: 161 % — ABNORMAL HIGH (ref 60–150)

## 2019-12-30 LAB — HEMOGLOBIN A1C
Hgb A1c MFr Bld: 5.3 % (ref 4.8–5.6)
Mean Plasma Glucose: 105 mg/dL

## 2019-12-30 MED ORDER — CLOPIDOGREL BISULFATE 75 MG PO TABS: 75.0000 mg | ORAL_TABLET | Freq: Every day | ORAL | 0 refills | Status: AC

## 2019-12-30 MED ORDER — ASPIRIN 81 MG PO TBEC
81.0000 mg | DELAYED_RELEASE_TABLET | Freq: Every day | ORAL | 0 refills | Status: DC
Start: 2019-12-31 — End: 2021-05-07

## 2019-12-30 MED ORDER — ATORVASTATIN CALCIUM 40 MG PO TABS
40.0000 mg | ORAL_TABLET | Freq: Every day | ORAL | 0 refills | Status: DC
Start: 2019-12-31 — End: 2021-01-22

## 2019-12-30 NOTE — Discharge Instructions (Signed)
 Hospital Discharge After a Stroke  Being discharged from the hospital after a stroke can feel overwhelming. Many things may be different, and it is normal to feel scared or anxious. Some stroke survivors may be able to return to their homes, and others may need more specialized care on a temporary or permanent basis. Your stroke care team will work with you to develop a discharge plan that is best for you. Ask questions if you do not understand something. Invite a friend or family member to participate in discharge planning. Understanding and following your discharge plan can help to prevent another stroke or other problems. Understanding your medicines After a stroke, your health care provider may prescribe one or more types of medicine. It is important to take medicines exactly as told by your health care provider. Serious harm, such as another stroke, can happen if you are unable to take your medicine exactly as prescribed. Make sure you understand:  What medicine to take.  Why you are taking the medicine.  How and when to take it.  If it can be taken with your other medicines and herbal supplements.  Possible side effects.  When to call your health care provider if you have any side effects.  How you will get and pay for your medicines. Medical assistance programs may be able to help you pay for prescription medicines if you cannot afford them. If you are taking an anticoagulant, be sure to take it exactly as told by your health care provider. This type of medicine can increase the risk of bleeding because it works to prevent blood from clotting. You may need to take certain precautions to prevent bleeding. You should contact your health care provider if you have:  Bleeding or bruising.  A fall or other injury to your head.  Blood in your urine or stool (feces). Planning for home safety  Take steps to prevent falls, such as installing grab bars or using a shower chair. Ask a  friend or family member to get needed things in place before you go home if possible. A therapist can come to your home to make recommendations for safety equipment. Ask your health care provider if you would benefit from this service or from home care. Getting needed equipment Ask your health care provider for a list of any medical equipment and supplies you will need at home. These may include items such as:  Walkers.  Canes.  Wheelchairs.  Hand-strengthening devices.  Special eating utensils. Medical equipment can be rented or purchased, depending on your insurance coverage. Check with your insurance company about what is covered. Keeping follow-up visits After a stroke, you will need to follow up regularly with a health care provider. You may also need rehabilitation, which can include physical therapy, occupational therapy, or speech-language therapy. Keeping these appointments is very important to your recovery after a stroke. Be sure to bring your medicine list and discharge papers with you to your appointments. If you need help to keep track of your schedule, use a calendar or appointment reminder. Preventing another stroke Having a stroke puts you at risk for another stroke in the future. Ask your health care provider what actions you can take to lower the risk. These may include:  Increasing how much you exercise.  Making a healthy eating plan.  Quitting smoking.  Managing other health conditions, such as high blood pressure, high cholesterol, or diabetes.  Limiting alcohol use. Knowing the warning signs of a stroke  Make   sure you understand the signs of a stroke. Before you leave the hospital, you will receive information outlining the stroke warning signs. Share these with your friends and family members. "BE FAST" is an easy way to remember the main warning signs of a stroke:  B - Balance. Signs are dizziness, sudden trouble walking, or loss of balance.  E - Eyes.  Signs are trouble seeing or a sudden change in vision.  F - Face. Signs are sudden weakness or numbness of the face, or the face or eyelid drooping on one side.  A - Arms. Signs are weakness or numbness in an arm. This happens suddenly and usually on one side of the body.  S - Speech. Signs are sudden trouble speaking, slurred speech, or trouble understanding what people say.  T - Time. Time to call emergency services. Write down what time symptoms started. Other signs of stroke may include:  A sudden, severe headache with no known cause.  Nausea or vomiting.  Seizure. These symptoms may represent a serious problem that is an emergency. Do not wait to see if the symptoms will go away. Get medical help right away. Call your local emergency services (911 in the [place] Do not drive yourself to the hospital. Make note of the time that you had your first symptoms. Your emergency responders or emergency room staff will need to know this information. Summary  Being discharged from the hospital after a stroke can feel overwhelming. It is normal to feel scared or anxious.  Make sure you take medicines exactly as told by your health care provider.  Know the warning signs of a stroke, and get help right way if you have any of these symptoms. "BE FAST" is an easy way to remember the main warning signs of a stroke. This information is not intended to replace advice given to you by your health care provider. Make sure you discuss any questions you have with your health care provider. Document Revised: 11/16/2018 Document Reviewed: 05/29/2016 Elsevier Patient Education  2020 [employer] Dear Antonio Santos,  It was a pleasure taking care of you. You were admitted for an acute Right Inferior Medial Thalamic infarct. You recovered very well. You have been started on Aspirin and Atorvastatin which you will take daily. You have also been started on Plavix for 3 weeks, make sure to complete this course.  Make sure to see your Primary Care Provider within the next week. Ensure you make your follow up Neurology appointment.   Thank you very much

## 2019-12-30 NOTE — Evaluation (Signed)
Physical Therapy Evaluation Patient Details Name: Antonio Santos MRN: [id number] DOB: 1975-09-27 Today's Date: 12/30/2019   History of Present Illness  44 y.o. male with history of obesity, back pain, hypercholesterolemia, seen 10/21 at [facility] for visual disturbance, concerning for posterior circulation stroke with reported negative MRI and discharge to follow-up with ophthalmology. At the opthamologist's office he developed R gaze, flacced LUE, LLE weakness and L facial droop, presented via EMS to [facility] for evaluation. MRI revealed Medial R thalamic infarct, Contralateral punctate medial L thalamic infarct.  Clinical Impression  Pt presents to PT at or near his functional baseline. Pt reports no current deficits and has no observable functional mobility deficits at this time, ambulating for community distances and performing multiple dynamic gait and balance tasks without deviation. PT provides education on BEFAST for stroke symptoms. Pt has no further PT needs at this time. Acute PT signing off.    Follow Up Recommendations No PT follow up    Equipment Recommendations  None recommended by PT    Recommendations for Other Services       Precautions / Restrictions Precautions Precautions: None Restrictions Weight Bearing Restrictions: No      Mobility  Bed Mobility Overal bed mobility: Independent             General bed mobility comments: HOB elevated    Transfers Overall transfer level: Independent Equipment used: None                Ambulation/Gait Ambulation/Gait assistance: Independent Gait Distance (Feet): 200 Feet Assistive device: None Gait Pattern/deviations: WFL(Within Functional Limits) Gait velocity: functional Gait velocity interpretation: >4.37 ft/sec, indicative of normal walking speed General Gait Details: steady step through gait, brisk pace, able to perform head turns, change gait speed, stop abruptly, and turn quickly  without gait deviations  Stairs Stairs: Yes Stairs assistance: Independent Stair Management: No rails;Alternating pattern;Forwards Number of Stairs: 10    Wheelchair Mobility    Modified Rankin (Stroke Patients Only) Modified Rankin (Stroke Patients Only) Pre-Morbid Rankin Score: No symptoms Modified Rankin: No symptoms     Balance Overall balance assessment: No apparent balance deficits (not formally assessed)                                           Pertinent Vitals/Pain Pain Assessment: No/denies pain    Home Living Family/patient expects to be discharged to:: Private residence Living Arrangements: Spouse/significant other Available Help at Discharge: Family Type of Home: House Home Access: Stairs to enter Entrance Stairs-Rails: Lawyer of Steps: 5 at front, 8 at back Home Layout: One level Home Equipment: None      Prior Function Level of Independence: Independent         Comments: working, Automotive engineer   Dominant Hand: Right    Extremity/Trunk Assessment   Upper Extremity Assessment Upper Extremity Assessment: Defer to OT evaluation    Lower Extremity Assessment Lower Extremity Assessment: Overall WFL for tasks assessed    Cervical / Trunk Assessment Cervical / Trunk Assessment: Normal  Communication   Communication: No difficulties  Cognition Arousal/Alertness: Awake/alert Behavior During Therapy: WFL for tasks assessed/performed Overall Cognitive Status: Within Functional Limits for tasks assessed  General Comments: administered Short Blessed Test with pt receiving score of 4, indicative of "normal cognition"      General Comments General comments (skin integrity, edema, etc.): VSS on RA    Exercises     Assessment/Plan    PT Assessment Patent does not need any further PT services  PT Problem List         PT Treatment  Interventions      PT Goals (Current goals can be found in the Care Plan section)  Acute Rehab PT Goals Patient Stated Goal: home today    Frequency     Barriers to discharge        Co-evaluation               AM-PAC PT "6 Clicks" Mobility  Outcome Measure Help needed turning from your back to your side while in a flat bed without using bedrails?: None Help needed moving from lying on your back to sitting on the side of a flat bed without using bedrails?: None Help needed moving to and from a bed to a chair (including a wheelchair)?: None Help needed standing up from a chair using your arms (e.g., wheelchair or bedside chair)?: None Help needed to walk in hospital room?: None Help needed climbing 3-5 steps with a railing? : None 6 Click Score: 24    End of Session   Activity Tolerance: Patient tolerated treatment well Patient left: in bed;with call bell/phone within reach;with family/visitor present Nurse Communication: Mobility status      Time: 3662-9476 PT Time Calculation (min) (ACUTE ONLY): 9 min   Charges:   PT Evaluation $PT Eval Low Complexity: 1 Low          Arlyss Gandy, PT, DPT Acute Rehabilitation Pager: [phone]   Arlyss Gandy 12/30/2019, 9:27 AM

## 2019-12-30 NOTE — Progress Notes (Signed)
Discharge instructions reviewed with patient and patient's wife using teach-back confirmed understanding. Patient has left the unit ambulatory with all belongings. Telemetry and IV removed.

## 2019-12-30 NOTE — Evaluation (Signed)
Occupational Therapy Evaluation and Discharge  Patient Details Name: Antonio Santos MRN: [id number] DOB: 12/20/1975 Today's Date: 12/30/2019    History of Present Illness 44 y.o. male with history of obesity, back pain, hypercholesterolemia, seen 10/21 at [facility] for visual disturbance, concerning for posterior circulation stroke with reported negative MRI and discharge to follow-up with ophthalmology. At the opthamologist's office he developed R gaze, flacced LUE, LLE weakness and L facial droop, presented via EMS to [facility] for evaluation. MRI revealed Medial R thalamic infarct, Contralateral punctate medial L thalamic infarct.   Clinical Impression   This 44 y/o male presents with the above. PTA pt independent with ADL, iADL and mobility tasks. OT evaluation completed today with no further acute OT needs identified. Pt demonstrating room level mobility without AD, standing and LB ADL tasks at mod independent level. Pt reports diplopia and other initial symptoms have since resolved, reports feeling at his baseline with ADL/mobility tasks; spouse present and in agreement. Educated both pt/pt's spouse re: signs and symptoms of stroke including BEFAST acronym. All questions answered with no further acute OT needs identified. Feel pt is safe to return home from OT standpoint once medically ready. Acute OT to sign off at this time, thank you for this referral.     Follow Up Recommendations  No OT follow up;Supervision - Intermittent    Equipment Recommendations  None recommended by OT           Precautions / Restrictions Precautions Precautions: None Restrictions Weight Bearing Restrictions: No      Mobility Bed Mobility Overal bed mobility: Modified Independent             General bed mobility comments: HOB elevated    Transfers Overall transfer level: Modified independent Equipment used: None                  Balance Overall balance assessment:  No apparent balance deficits (not formally assessed)                                         ADL either performed or assessed with clinical judgement   ADL Overall ADL's : At baseline                                       General ADL Comments: pt demonstrating LB ADL, standing grooming ADL and mobilizing household distances without AD at mod independent level. no overt LOB or difficulties noted      Vision Baseline Vision/History: Wears glasses Wears Glasses:  ("when I need them") Patient Visual Report: Other (comment) (initially diplopia, has since resolved ) Vision Assessment?: No apparent visual deficits;Yes Eye Alignment: Within Functional Limits Ocular Range of Motion: Within Functional Limits Alignment/Gaze Preference: Within Defined Limits Tracking/Visual Pursuits: Able to track stimulus in all quads without difficulty Visual Fields: No apparent deficits     Perception     Praxis      Pertinent Vitals/Pain Pain Assessment: No/denies pain     Hand Dominance Right   Extremity/Trunk Assessment Upper Extremity Assessment Upper Extremity Assessment: Overall WFL for tasks assessed (symmetrical strength)   Lower Extremity Assessment Lower Extremity Assessment: Overall WFL for tasks assessed;Defer to PT evaluation   Cervical / Trunk Assessment Cervical / Trunk Assessment: Normal   Communication Communication Communication: No difficulties  Cognition Arousal/Alertness: Awake/alert Behavior During Therapy: WFL for tasks assessed/performed Overall Cognitive Status: Within Functional Limits for tasks assessed                                 General Comments: administered Short Blessed Test with pt receiving score of 4, indicative of "normal cognition"                    Home Living Family/patient expects to be discharged to:: Private residence Living Arrangements: Spouse/significant other Available Help at  Discharge: Family Type of Home: House Home Access: Stairs to enter [employer] of Steps: 5 at front, 8 at back Entrance Stairs-Rails: Left;Right Home Layout: One level     Bathroom Shower/Tub: Producer, television/film/video: Standard     Home Equipment: None          Prior Functioning/Environment Level of Independence: Independent        Comments: working, Designer, industrial/product Problem List: Impaired vision/perception;Obesity;Decreased knowledge of precautions      OT Treatment/Interventions:      OT Goals(Current goals can be found in the care plan section) Acute Rehab OT Goals Patient Stated Goal: home today OT Goal Formulation: All assessment and education complete, DC therapy  OT Frequency:     Barriers to D/C:            Co-evaluation              AM-PAC OT "6 Clicks" Daily Activity     Outcome Measure Help from another person eating meals?: None Help from another person taking care of personal grooming?: None Help from another person toileting, which includes using toliet, bedpan, or urinal?: None Help from another person bathing (including washing, rinsing, drying)?: None Help from another person to put on and taking off regular upper body clothing?: None Help from another person to put on and taking off regular lower body clothing?: None 6 Click Score: 24   End of Session Nurse Communication: Mobility status  Activity Tolerance: Patient tolerated treatment well Patient left: with call bell/phone within reach;with family/visitor present;Other (comment) (seated EOB)  OT Visit Diagnosis: Other symptoms and signs involving the nervous system (D22.025)                Time: 4270-6237 OT Time Calculation (min): 22 min Charges:  OT General Charges $OT Visit: 1 Visit OT Evaluation $OT Eval Moderate Complexity: 1 Mod  Marcy Siren, OT Acute Rehabilitation Services Pager [phone] Office [phone]   Orlando Penner 12/30/2019, 9:10 AM

## 2019-12-30 NOTE — Progress Notes (Signed)
Family Medicine Teaching Service Daily Progress Note Intern Pager: [phone]  Patient name: Antonio Santos Medical record number: [id number] Date of birth: 1975/11/24 Age: 44 y.o. Gender: male  Primary Care Provider: Salli Real, MD Consultants: Neuro Code Status: Full  Pt Overview and Major Events to Date:  10/21 Admitted  Assessment and Plan:  Teodor Prater a 44 y.o.malepresenting with Left sided weakness and Left sided facial drop and double vision, found to have R Inferomedial thalamus ischemic stroke.NosignificantPMHx.    Right Acute Infarct of the Inferior Medial Thalamus:[facility] a newinferior medial right thalamus infarct.Has recovered approximately 90-95% on his Left side compared to Right. Reports that his vision has returned to normal. Recommended he return to Ophthalmology office to finish full vision testing -Neuro following, appreciate recs -BP goal permissive HTN<220/120 mmHg -Continue ASA 81 mg daily -Continue Atorvastatin 40 mg -A1C is 5.3 -Hypercoag panel ordered -PT/OTeval -Encourage full tobacco cessation and weight loss  -Continuous cardiac monitoring   AKI On Unknown Baseline:On admission has Creatinine of 1.37. no previous values to compare to, therefore unable to determine what degree of this is chronic, if any.Today Creatinine was 1.35. -Continue to trend   HLD Pt prescribed fenofibrate by his pcp at some point in the past. No previous lipid panels available in EHR. Pt states he has never been on a statin.  -Continue Atorvastatin 80 mg daily    ChronicBack Pain:Fell down stair approximately 1 year ago. Takes Oxycodone and muscle relaxer. Once med rec completed can consider restarting muscle relaxer. -Start Oxycodone 5-325 q6 PRN severe pain -Can restart muscle relaxerwhen appropriate   FEN/GI: Regular Diet/ Miralax PPx: SCD's  Disposition: Med-Surg  Prior to Admission Living Arrangement:  Home Anticipated Discharge Location: Home Barriers to Discharge: None Anticipated discharge today.   Subjective:  Interviewed patient at bedside.  He reports feeling good today and excited for discharge. He reports that his vision has completely returned to normal. Discussed returning to Ophthalmology to finish full vision work up. Discussed continuing Aspirin and Statin, starting Cardio exercise regularly, stopping chewing tobacco, and weight loss. He has no concerns at this time.   Objective: Temp:  [97.6 F (36.4 C)-98.7 F (37.1 C)] 97.6 F (36.4 C) (10/23 0402) Pulse Rate:  [72-94] 72 (10/23 0402) Resp:  [17-23] 18 (10/23 0402) BP: (127-160)/(56-136) 127/77 (10/23 0402) SpO2:  [94 %-99 %] 98 % (10/23 0402) Physical Exam:  Physical Exam Vitals and nursing note reviewed.  Constitutional:      General: He is not in acute distress.    Appearance: Normal appearance. He is obese. He is not ill-appearing, toxic-appearing or diaphoretic.  HENT:     Head: Normocephalic and atraumatic.  Cardiovascular:     Rate and Rhythm: Normal rate and regular rhythm.     Pulses: Normal pulses.     Heart sounds: Normal heart sounds.  Pulmonary:     Effort: Pulmonary effort is normal. No respiratory distress.     Breath sounds: Normal breath sounds. No wheezing.  Abdominal:     General: There is no distension.     Palpations: There is no mass.     Tenderness: There is no abdominal tenderness.  Musculoskeletal:     Right lower leg: No edema.     Left lower leg: No edema.  Neurological:     Mental Status: He is alert.  Psychiatric:        Mood and Affect: Mood normal.        Behavior: Behavior normal.  Thought Content: Thought content normal.      Laboratory: Recent Labs  Lab 12/28/19 0907 12/28/19 0907 12/28/19 1455 12/28/19 1504 12/29/19 0217  WBC 8.0  --  7.6  --  7.9  HGB 14.0   < > 13.9 13.6 13.6  HCT 40.2   < > 41.2 40.0 40.7  PLT 215  --  211  --  206   < > =  values in this interval not displayed.   Recent Labs  Lab 12/28/19 0907 12/28/19 0907 12/28/19 1455 12/28/19 1455 12/28/19 1504 12/29/19 0217 12/30/19 0144  NA 139   < > 138   < > 139 140 139  K 4.1   < > 3.9   < > 3.8 3.6 3.8  CL 102   < > 103   < > 103 104 105  CO2 27   < > 25  --   --  24 24  BUN 17   < > 15   < > 16 15 18   CREATININE 1.24   < > 1.37*   < > 1.30* 1.36* 1.35*  CALCIUM 10.3   < > 10.0  --   --  9.7 8.9  PROT 8.3*  --  7.4  --   --  6.9  --   BILITOT 0.8  --  0.7  --   --  0.7  --   ALKPHOS 43  --  41  --   --  37*  --   ALT 31  --  30  --   --  28  --   AST 31  --  31  --   --  25  --   GLUCOSE 99   < > 119*   < > 115* 107* 103*   < > = values in this interval not displayed.    A1C: 5.3  Imaging/Diagnostic Tests:   MR Brain WO Contrast: 11 mm acute ischemic nonhemorrhagic medial right thalamic infarct. Additional subtle 4 mm ischemic nonhemorrhagic infarct involving the contralateral medial left thalamus. Constellation of findings suspicious for an artery of Percheron infarction. No other acute intracranial abnormality.   CT Cerebral Perfusion W Contrast:No large vessel occlusion or hemodynamically significant stenosis. No evidence of core infarction or penumbra on perfusion imaging.   CT Angio Neck Code Stroke 10/21 4 PM: See below   CT Angio Head Code Stroke 10/21 4 PM:No large vessel occlusion or hemodynamically significant stenosis. No evidence of core infarction or penumbra on perfusion imaging.   CT Head Code Stroke WO Contrast 10/21 3 PM:No acute intracranial hemorrhage. Possible small area of acute infarction in the inferior medial right thalamus, noting corresponding mild diffusion hyperintensity on the prior MRI   MR Brain W WO Contrast 10/21 12 PM: See below   MR Angio Head WO Contrast 10/21 12 PM:No evidence of acute intracranial abnormality. Specifically, no acute infarct. No hemodynamically significant stenosis or  proximal large vessel occlusion.   CT Head Code Stroke 10/21 9 AM:No acute intracranial process. ASPECTS is 10   11/21, MD 12/30/2019, 6:35 AM PGY-1, [facility] Family Medicine FPTS Intern pager: [phone], text pages welcome

## 2019-12-30 NOTE — Discharge Summary (Signed)
Family Medicine Teaching [facility] Discharge Summary  Patient name: Antonio Santos Medical record number: [id number] Date of birth: 1976-01-30 Age: 44 y.o. Gender: male Date of Admission: 12/28/2019  Date of Discharge: 12/30/2019 Admitting Physician: Lauro Franklin, MD  Primary Care Provider: Salli Real, MD Consultants: Neurology  Indication for Hospitalization: Stroke  Discharge Diagnoses/Problem List:  Acute Infarct of the Right Inferior Medial Thalamus: Possible AKIOn Unknown Baseline:   Disposition: Discharge Home  Discharge Condition: Stable  Discharge Exam: Physical Exam Vitals and nursing note reviewed.  Constitutional:      General: He is not in acute distress.    Appearance: Normal appearance. He is obese. He is not ill-appearing, toxic-appearing or diaphoretic.  HENT:     Head: Normocephalic and atraumatic.  Cardiovascular:     Rate and Rhythm: Normal rate and regular rhythm.     Pulses: Normal pulses.          Radial pulses are 2+ on the right side and 2+ on the left side.       Dorsalis pedis pulses are 2+ on the right side and 2+ on the left side.     Heart sounds: Normal heart sounds, S1 normal and S2 normal. No murmur heard.   Pulmonary:     Effort: Pulmonary effort is normal. No respiratory distress.     Breath sounds: Normal breath sounds. No wheezing.  Abdominal:     General: There is no distension.     Palpations: There is no mass.     Tenderness: There is no abdominal tenderness.  Musculoskeletal:     Right lower leg: No edema.     Left lower leg: No edema.  Neurological:     Mental Status: He is alert. Mental status is at baseline.     Comments: Strength: approximately 5/5 Bilaterally Upper and Lower Extremities  Psychiatric:        Mood and Affect: Mood normal.        Behavior: Behavior normal.        Thought Content: Thought content normal.      Brief Hospital Course:  Acute Infarct of the Right Inferior Medial Thalamus: Had new  onset vision changes at 645 AM 10/21 and went to [facility] for work up, including MRI, which at that time was negative. Later experienced vision changes and left sided weakness at the ophthalmologists office around 2 PM. Presented to [facility] where MRI revealed a new inferior medial right thalamus infarct. Was outside of tPA window. Had rapid recovery of Left arm and leg function, by time of admission was already able to move both. By the next morning was approximately 90% strength of his Right side. By discharge his vision changes had completely subsided.   Possible AKIOn Unknown Baseline: On admission had a Creatinine of 1.37. Had good oral intake during admission. On discharge Creatinine was 1.35.    Issues for Follow Up:  1. Acute Infarct of the Right Inferior Medial Thalamus: Ensure follow up with Neurology has been completed. Continue to monitor for any sequela. Ensure taking Aspirin and Atorvastatin daily and that Plavix course is completed (end date is Nov 12).   2. Possible AKIOn Unknown Baseline: Obtain BMP to monitor for changes in Creatinine or to establish a baseline.   Significant Procedures: None  Significant Labs and Imaging:  Recent Labs  Lab 12/28/19 0907 12/28/19 0907 12/28/19 1455 12/28/19 1504 12/29/19 0217  WBC 8.0  --  7.6  --  7.9  HGB 14.0   < >  13.9 13.6 13.6  HCT 40.2   < > 41.2 40.0 40.7  PLT 215  --  211  --  206   < > = values in this interval not displayed.   Recent Labs  Lab 12/28/19 0907 12/28/19 0907 12/28/19 1455 12/28/19 1455 12/28/19 1504 12/28/19 1504 12/29/19 0217 12/30/19 0144  NA 139  --  138  --  139  --  140 139  K 4.1   < > 3.9   < > 3.8   < > 3.6 3.8  CL 102  --  103  --  103  --  104 105  CO2 27  --  25  --   --   --  24 24  GLUCOSE 99  --  119*  --  115*  --  107* 103*  BUN 17  --  15  --  16  --  15 18  CREATININE 1.24  --  1.37*  --  1.30*  --  1.36* 1.35*  CALCIUM 10.3  --  10.0  --   --   --  9.7 8.9  ALKPHOS 43  --  41  --    --   --  37*  --   AST 31  --  31  --   --   --  25  --   ALT 31  --  30  --   --   --  28  --   ALBUMIN 4.9  --  4.6  --   --   --  4.1  --    < > = values in this interval not displayed.   Lipid Panel     Component Value Date/Time   CHOL 218 (H) 12/29/2019 0217   TRIG 286 (H) 12/29/2019 0217   HDL 47 12/29/2019 0217   CHOLHDL 4.6 12/29/2019 0217   VLDL 57 (H) 12/29/2019 0217   LDLCALC 114 (H) 12/29/2019 0217    A1C: 5.3   Results/Tests Pending at Time of Discharge: Echo Reading, Anticoagulation Panel  Discharge Medications:  Allergies as of 12/30/2019      Reactions   Penicillins Other (See Comments)   Occurred when he was a child      Medication List    STOP taking these medications   cyclobenzaprine 10 MG tablet Commonly known as: FLEXERIL     TAKE these medications   aspirin 81 MG EC tablet Take 1 tablet (81 mg total) by mouth daily. Swallow whole. Start taking on: December 31, 2019   atorvastatin 40 MG tablet Commonly known as: LIPITOR Take 1 tablet (40 mg total) by mouth daily. Start taking on: December 31, 2019   clopidogrel 75 MG tablet Commonly known as: PLAVIX Take 1 tablet (75 mg total) by mouth daily for 19 days. Start taking on: December 31, 2019   fenofibrate micronized 200 MG capsule Commonly known as: LOFIBRA Take 200 mg by mouth daily.   oxyCODONE-acetaminophen 5-325 MG tablet Commonly known as: PERCOCET/ROXICET Take 1 tablet by mouth 4 (four) times daily as needed.   traZODone 100 MG tablet Commonly known as: DESYREL Take 100 mg by mouth at bedtime.       Discharge Instructions: Please refer to Patient Instructions section of EMR for full details.  Patient was counseled important signs and symptoms that should prompt return to medical care, changes in medications, dietary instructions, activity restrictions, and follow up appointments.   Follow-Up Appointments:  Follow-up Information    [facility]. Schedule an  appointment as soon as possible for a visit in 4 week(s).   Specialty: Neurology Why: for stroke follow up Contact information: [address] [place] [address] [phone]       [facility]. Schedule an appointment as soon as possible for a visit in 2 week(s).   Specialty: Sleep Medicine Contact information: [address] [place] [address] [phone]              Lauro Franklin, MD 12/30/2019, 10:48 AM PGY-1, [facility] Family [facility]

## 2019-12-31 LAB — CARDIOLIPIN ANTIBODIES, IGG, IGM, IGA
Anticardiolipin IgA: <9 APL U/mL (ref 0–11)
Anticardiolipin IgG: <9 GPL U/mL (ref 0–14)
Anticardiolipin IgM: 16 MPL U/mL — ABNORMAL HIGH (ref 0–12)

## 2020-01-01 ENCOUNTER — Telehealth: Payer: Self-pay | Admitting: Family Medicine

## 2020-01-01 NOTE — Telephone Encounter (Signed)
Work excuse note for 2 weeks.

## 2020-01-02 LAB — PROTHROMBIN GENE MUTATION

## 2020-01-02 LAB — FACTOR 5 LEIDEN

## 2020-01-03 LAB — CBG MONITORING, ED: Glucose-Capillary: 124 mg/dL — ABNORMAL HIGH (ref 70–99)

## 2020-01-25 ENCOUNTER — Ambulatory Visit (INDEPENDENT_AMBULATORY_CARE_PROVIDER_SITE_OTHER): Payer: 59 | Admitting: Neurology

## 2020-01-25 ENCOUNTER — Encounter: Payer: Self-pay | Admitting: Neurology

## 2020-01-25 VITALS — BP 155/90 | HR 92 | Ht 72.0 in | Wt 283.0 lb

## 2020-01-25 DIAGNOSIS — E669 Obesity, unspecified: Secondary | ICD-10-CM | POA: Insufficient documentation

## 2020-01-25 DIAGNOSIS — H532 Diplopia: Secondary | ICD-10-CM | POA: Insufficient documentation

## 2020-01-25 DIAGNOSIS — R0683 Snoring: Secondary | ICD-10-CM | POA: Insufficient documentation

## 2020-01-25 DIAGNOSIS — I63331 Cerebral infarction due to thrombosis of right posterior cerebral artery: Secondary | ICD-10-CM

## 2020-01-25 DIAGNOSIS — R0681 Apnea, not elsewhere classified: Secondary | ICD-10-CM | POA: Insufficient documentation

## 2020-01-25 NOTE — Progress Notes (Signed)
SLEEP MEDICINE CLINIC    Provider:  Melvyn Novasarmen  Candid Bovey, MD  Primary Care Physician:  Salli RealSun, Yun, MD [address] [place] [address]     Referring Provider: Dr. Mateo FlowSethi,MD          Chief Complaint according to patient   Patient presents with:     New Patient (Initial Visit)     pt alone, rm 11. presents today to address ? OSA. had a stroke 10/21, during hospital witnessed apnea events. he snores. never had a SS. avg 4-5 hrs of restless sleep.       HISTORY OF PRESENT ILLNESS:  Antonio Santos is a 44 y.o. year old White or Caucasian male patient seen here upon a referral from the STROKE MD service , on  01/25/2020 for consultation. Chief concern according to patient :   I had a stroke and in hospital was witnessed sleep apnea.    I have the pleasure of seeing Antonio Santos today, a right-handed White or Caucasian male with a possible sleep disorder.      Sleep relevant medical history: snoring,  restricited sleep, CVA 12-2019,.   Family medical /sleep history: no other family member on CPAP with OSA, insomnia, sleep walkers.    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  and lives in a household with spouse and 2 children , age 45, 5324 and 3 dogs are present. He works 6-5 every day. Tobacco use: quit 5 years ago 2015- he chews tobacco at work. ETOH use : beer on weekends- 3 a week,  Caffeine intake in form of Coffee(/ ) Soda( 3 mt dews a day ) Tea ( /) or energy drinks. Regular exercise in form of  walking. .      Sleep habits are as follows: The patient's dinner time is between 6-8 PM. The patient goes to bed at 9.30-11 PM , TV is off when he goes to sleep-  and continues to sleep for 3-5 hours, wakes for one bathroom breaks.   The preferred sleep position is side and supine, and prone, with the support of 1 pillow.  Dreams are reportedly rare.  5.30  AM is the usual rise time. The patient wakes up  spontaneously.  He reports not feeling refreshed or restored in AM, with symptoms such as dry mouth,  Naps are taken infrequently, unable to nap.    Review of Systems: Out of a complete 14 system review, the patient complains of only the following symptoms, and all other reviewed systems are negative.:  Fatigue, sleepiness , snoring, fragmented sleep,short sleeper since mid twenties.    How likely are you to doze in the following situations: 0 = not likely, 1 = slight chance, 2 = moderate chance, 3 = high chance   Sitting and Reading? Watching Television? Sitting inactive in a public place (theater or meeting)? As a passenger in a car for an hour without a break? Lying down in the afternoon when circumstances permit? Sitting and talking to someone? Sitting quietly after lunch without alcohol? In a car, while stopped for a few minutes in traffic?   Total = 6/ 24 points   FSS endorsed at 19/ 63 points.   Social History   Socioeconomic History   Marital status: Married    Spouse name: Not on file   Number of children: Not on file   Years of education: Not on  file   Highest education level: Not on file  Occupational History   Not on file  Tobacco Use   Smoking status: Never Smoker   Smokeless tobacco: Never Used  Substance and Sexual Activity   Alcohol use: Never   Drug use: Never   Sexual activity: Not on file  Other Topics Concern   Not on file  Social History Narrative   Not on file   Social Determinants of Health   Financial Resource Strain:    Difficulty of Paying Living Expenses: Not on file  Food Insecurity:    Worried About Running Out of Food in the Last Year: Not on file   Ran Out of Food in the Last Year: Not on file  Transportation Needs:    Lack of Transportation (Medical): Not on file   Lack of Transportation (Non-Medical): Not on file  Physical Activity:    Days of Exercise per Week: Not on file   Minutes of Exercise per Session:  Not on file  Stress:    Feeling of Stress : Not on file  Social Connections:    Frequency of Communication with Friends and Family: Not on file   Frequency of Social Gatherings with Friends and Family: Not on file   Attends Religious Services: Not on file   Active Member of Clubs or Organizations: Not on file   Attends Banker Meetings: Not on file   Marital Status: Not on file    No family history on file. parents were morning people, farmers.   No past medical history on file. CVA - 01-03-2020.   No past surgical history on file.   Current Outpatient Medications on File Prior to Visit  Medication Sig Dispense Refill   Ascorbic Acid (VITAMIN C) 500 MG CAPS Take 500 mg by mouth daily.     aspirin EC 81 MG EC tablet Take 1 tablet (81 mg total) by mouth daily. Swallow whole. 30 tablet 0   atorvastatin (LIPITOR) 40 MG tablet Take 1 tablet (40 mg total) by mouth daily. 30 tablet 0   Cholecalciferol (VITAMIN D-3) 125 MCG (5000 UT) TABS Take 1 tablet by mouth daily.     Cyanocobalamin (VITAMIN B-12) 5000 MCG TBDP Take 500 mcg by mouth daily.     fenofibrate micronized (LOFIBRA) 200 MG capsule Take 200 mg by mouth daily.      oxyCODONE-acetaminophen (PERCOCET/ROXICET) 5-325 MG tablet Take 1 tablet by mouth 4 (four) times daily as needed.     No current facility-administered medications on file prior to visit.    Allergies  Allergen Reactions   Penicillins Other (See Comments)    Occurred when he was a child    Physical exam:  Today's Vitals   01/25/20 1116  BP: (!) 155/90  Pulse: 92  Weight: 283 lb (128.4 kg)  Height: 6' (1.829 m)   Body mass index is 38.38 kg/m.   Wt Readings from Last 3 Encounters:  01/25/20 283 lb (128.4 kg)  12/28/19 287 lb 7.7 oz (130.4 kg)  12/28/19 285 lb (129.3 kg)     Ht Readings from Last 3 Encounters:  01/25/20 6' (1.829 m)  12/28/19 6' (1.829 m)  12/28/19 6' (1.829 m)      General: The patient is awake,  alert and appears not in acute distress. The patient is well groomed. Head: Normocephalic, atraumatic. Neck is supple. Mallampati 1,  neck circumference: 22 inches . Nasal airflow patent.   Retrognathia is not  seen. Facial hair.  Dental status: intact , bruxism marks.  Cardiovascular:  Regular rate and cardiac rhythm by pulse,  without distended neck veins. Respiratory: Lungs are clear to auscultation.  Skin:  Without evidence of ankle edema, or rash. Trunk: The patient's posture is erect.   Neurologic exam : The patient is awake and alert, oriented to place and time.   Memory subjective described as intact.  Attention span & concentration ability appears normal.  Speech is fluent,  without  dysarthria, dysphonia or aphasia.  Mood and affect are appropriate.   Cranial nerves: no loss of smell or taste reported  Pupils are equal and briskly reactive to light. Funduscopic exam deferred.   Left eye wider open.  Extraocular movements in vertical and horizontal planes were intact and without nystagmus.  No Diplopia. Visual fields by finger perimetry are intact. Hearing was intact to soft voice and finger rubbing.    Facial sensation intact to fine touch.  Facial motor strength is symmetric and tongue and uvula move midline.  Neck ROM : rotation, tilt and flexion extension were normal for age and shoulder shrug was symmetrical.    Motor exam:  Symmetric bulk, tone and ROM.   Normal tone without cog wheeling, symmetric grip strength .   Sensory:  Fine touch and vibration were normal.  Proprioception tested in the upper extremities was normal.   Coordination: Rapid alternating movements in the fingers/hands were of normal speed.  The Finger-to-nose maneuver was intact without evidence of ataxia, dysmetria or tremor.   Gait and station: Patient could rise unassisted from a seated position, walked without assistive device.  Deep tendon reflexes: in the  upper and lower extremities are  symmetric and intact.  Babinski response was deferred.   I the pleasure of meeting with Barbara Cower j. 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  His admission was for left-sided transient weakness and he was seen under the stroke team care of Dr. Roda Shutters. He was admitted on 12/29/2019 with slightly elevated blood pressures normal oxygen saturation and a regular pulse rate of 80.  His CBC was in normal range his metabolic labs were considered normal he had not been fasting when he came to the hospital so his glucose was elevated as expected his creatinine was 1.37 in light of a rather lower BUN.  But the patient does have a significant muscle mass.  Cholesterol was elevated to 218 triglycerides were very elevated at 286.  LDL was high and HDL was just below normal.  No alcohol was found in her system  An MRI angio head without contrast was performed because he presented with acute double vision and left-sided weakness the MRI showed no acute infarction but a hyperintensity in the medial right thalamus.  MRA showed no aneurysm bilateral posterior communicating arteries were dominant, no abnormal enhancement here.  No hydrocephalus CT head stroke protocol performed on the 21st left facial droop and arm weakness corresponding mild diffusion hyperintensity in the prior MRI that now appeared to be an inferior medial right thalamus infarct.  Dr.Aurora was present during the discussion of his radiology findings took place.  So possible inferior medial right thalamic hyperintensity.  The patient recovered his symptoms within a night at the hospital but this night we also noticed that he had apneas.  The patient has risk factors for OSA snoring, and some daytime drowsiness here it is colder that he also had some morning headaches.  However his risk  factors are obesity and a job that has variable hours available to sleep and is associated with a  lot of responsibility and stress.  I will order a home sleep test for him I do not think that he has to come in for an acute apnea evaluation.  We will see what it looks if his insurance allows [place] a home sleep test versus an inpatient overnight stay here at the sleep lab.  The patient would be perfect for an late night arrival.       After spending a total time of  30 minutes face to face and additional time for physical and neurologic examination, review of laboratory studies,  personal review of imaging studies, reports and results of other testing and review of referral information / records as far as provided in visit, I have established the following assessments:  1)  Acute thalamic bilateral lesions, small punctate. Transient loss of vision, diplopia, and left hemiparesis.  2)  Obesity, high cholesterol, snoring and witnessed apnea.  3) non restorative sleep.    My Plan is to proceed with:  1) HST or IN LAB PSG, with option to SPLIT at AHI 30 and higher, even after 1 hours.    I would like to thank Dr Roda Shutters and Dr Wilford Corner , stroke team,   for allowing me to meet with and to take care of this pleasant patient.   In short, Jeanluc Wegman is presenting with high risk factors for OSA   I plan to follow up either personally or through our NP within 3-4 month.   Electronically signed by: Melvyn Novas, MD 01/25/2020 11:32 AM  [facility] Board certified by The [employer] of Sleep Medicine and Diplomate of the [employer] of Sleep Medicine. Board certified In Neurology through the ABPN, Fellow of the [employer] of Neu[employer]. Medical Director of [facility].

## 2020-01-25 NOTE — Patient Instructions (Signed)

## 2020-01-29 ENCOUNTER — Ambulatory Visit (INDEPENDENT_AMBULATORY_CARE_PROVIDER_SITE_OTHER): Payer: 59 | Admitting: Adult Health

## 2020-01-29 ENCOUNTER — Other Ambulatory Visit: Payer: Self-pay

## 2020-01-29 ENCOUNTER — Encounter: Payer: Self-pay | Admitting: Adult Health

## 2020-01-29 VITALS — BP 128/76 | HR 91 | Ht 72.0 in | Wt 284.0 lb

## 2020-01-29 DIAGNOSIS — I639 Cerebral infarction, unspecified: Secondary | ICD-10-CM

## 2020-01-29 DIAGNOSIS — R791 Abnormal coagulation profile: Secondary | ICD-10-CM

## 2020-01-29 DIAGNOSIS — I10 Essential (primary) hypertension: Secondary | ICD-10-CM

## 2020-01-29 DIAGNOSIS — R7989 Other specified abnormal findings of blood chemistry: Secondary | ICD-10-CM

## 2020-01-29 DIAGNOSIS — E538 Deficiency of other specified B group vitamins: Secondary | ICD-10-CM

## 2020-01-29 DIAGNOSIS — E785 Hyperlipidemia, unspecified: Secondary | ICD-10-CM

## 2020-01-29 DIAGNOSIS — I6381 Other cerebral infarction due to occlusion or stenosis of small artery: Secondary | ICD-10-CM

## 2020-01-29 NOTE — Patient Instructions (Signed)
Continue aspirin 81 mg daily  and atorvastatin  for secondary stroke prevention  We will repeat lab work including B12, homocysteine level and protein C  Continue to follow up with PCP regarding cholesterol and blood pressure management  Maintain strict control of hypertension with blood pressure goal below 130/90, diabetes with hemoglobin A1c goal below 6.5% and cholesterol with LDL cholesterol (bad cholesterol) goal below 70 mg/dL.       Followup in the future with me in 4 months or call earlier if needed       Thank you for coming to see [place] at [facility]. I hope we have been able to provide you high quality care today.  You may receive a patient satisfaction survey over the next few weeks. We would appreciate your feedback and comments so that we may continue to improve ourselves and the health of our patients.

## 2020-01-29 NOTE — Progress Notes (Signed)
[facility] [address] [place]. [place] [address] [phone]       HOSPITAL FOLLOW UP NOTE  Mr. [name] [facility] Date of Birth:  Dec 21, 1975 Medical Record Number:  [id number]   Reason for Referral:  hospital stroke follow up    SUBJECTIVE:   CHIEF COMPLAINT:  Chief Complaint  Patient presents with  . Hospitalization Follow-up    tx rm, with wife, reports weakness on left side, numbness in in left arm    HPI:   [name] is a 44 y.o. male with history of obesity, back pain, and hypercholesterolemia who presented on 12/28/2019 at [facility] for visual disturbance, concerning for posterior circulation stroke with reported negative MRI and discharge to follow-up with ophthalmology. At the opthamologist's office he developed R gaze, flacced LUE, LLE weakness and L facial droop. He then presented via EMS to [facility] ED for evaluation.  Personally reviewed hospitalization pertinent progress notes, lab work and imaging with summary provided.  Evaluated by Dr. 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  Recommended DAPT for 3 weeks and aspirin alone.  LDL 114 and initiate atorvastatin 40 mg daily.  No history of DM or HTN.  Other stroke risk factors include suspected OSA and obesity but no prior stroke history.  Other active problems include AKI and chronic back pain.  Presenting symptoms resolved evaluated by therapies and was discharged home in stable condition without therapy needs.  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 Possible inferior medial R thalamic hypodensity. ASPECTS 10.     CTA head & neck no LVO  CT perfusion no core infarct or  penumbra  MRI  Medial R thalamic infarct. Contralateral punctate medial L thalamic infarct.  2D Echo EF 65 to 70%  LDL 114  HgbA1c 5.3  VTE prophylaxis - SCDs   No antithrombotic prior to admission, now on aspirin 81 mg daily and clopidogrel 75 mg daily. Continue DAPT x 3 weeks then aspirin alone  Therapy recommendations:   None  Disposition:   Home   Today, 01/29/2020, [name] is being seen for hospital follow-up accompanied by his wife.  He has been doing well since discharge with very slight left arm weakness and occasional short lasting left arm numbness. 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 Denies new or worsening stroke/TIA symptoms.  Hypercoagulable labs largely unremarkable except slightly elevated protein C and elevated homocysteine level during admission and has been taking B12 supplement.  Wife questions ongoing need or correct dosage amount of current B12 supplement. Remains on aspirin 81mg  daily without bleeding or bruising. Remains on atorvastatin 40mg  daily without myalgias. Blood pressure 128/76 with PCP recently initiating lisinopril-hydrochlorothiazide.  He was evaluated by Dr. for possible underlying sleep apnea on 11/18 and currently awaiting to undergo sleep study.  No further concerns at this time.      ROS:   14 system review of systems performed and negative with exception of those listed in HPI  PMH: No past medical history on file.  PSH: No past surgical history on file.  Social History:  Social History   Socioeconomic History  . Marital status: Married  Spouse name: Not on file  . Number of children: Not on file  . Years of education: Not on file  . Highest education level: Not on file  Occupational History  . Not on file  Tobacco Use  . Smoking status: Never Smoker  . Smokeless tobacco: Never Used  Substance and Sexual Activity  . Alcohol use: Never  . Drug use:  Never  . Sexual activity: Not on file  Other Topics Concern  . Not on file  Social History Narrative  . Not on file   Social Determinants of Health   Financial Resource Strain:   . Difficulty of Paying Living Expenses: Not on file  Food Insecurity:   . Worried About Programme researcher, broadcasting/film/video in the Last Year: Not on file  . Ran Out of Food in the Last Year: Not on file  Transportation Needs:   . Lack of Transportation (Medical): Not on file  . Lack of Transportation (Non-Medical): Not on file  Physical Activity:   . Days of Exercise per Week: Not on file  . Minutes of Exercise per Session: Not on file  Stress:   . Feeling of Stress : Not on file  Social Connections:   . Frequency of Communication with Friends and Family: Not on file  . Frequency of Social Gatherings with Friends and Family: Not on file  . Attends Religious Services: Not on file  . Active Member of Clubs or Organizations: Not on file  . Attends Banker Meetings: Not on file  . Marital Status: Not on file  Intimate Partner Violence:   . Fear of Current or Ex-Partner: Not on file  . Emotionally Abused: Not on file  . Physically Abused: Not on file  . Sexually Abused: Not on file    Family History: No family history on file.  Medications:   Current Outpatient Medications on File Prior to Visit  Medication Sig Dispense Refill  . Ascorbic Acid (VITAMIN C) 500 MG CAPS Take 500 mg by mouth daily.    Marland Kitchen aspirin EC 81 MG EC tablet Take 1 tablet (81 mg total) by mouth daily. Swallow whole. 30 tablet 0  . atorvastatin (LIPITOR) 40 MG tablet Take 1 tablet (40 mg total) by mouth daily. 30 tablet 0  . Cholecalciferol (VITAMIN D-3) 125 MCG (5000 UT) TABS Take 1 tablet by mouth daily.    . Cyanocobalamin (VITAMIN B-12) 5000 MCG TBDP Take 500 mcg by mouth daily.    . fenofibrate micronized (LOFIBRA) 200 MG capsule Take 200 mg by mouth daily.     Marland Kitchen lisinopril-hydrochlorothiazide (ZESTORETIC) 10-12.5 MG tablet Take 1  tablet by mouth daily.    Marland Kitchen oxyCODONE-acetaminophen (PERCOCET/ROXICET) 5-325 MG tablet Take 1 tablet by mouth 4 (four) times daily as needed.     No current facility-administered medications on file prior to visit.    Allergies:   Allergies  Allergen Reactions  . Penicillins Other (See Comments)    Occurred when he was a child      OBJECTIVE:  Physical Exam  Vitals:   01/29/20 1255  BP: 128/76  Pulse: 91  Weight: 284 lb (128.8 kg)  Height: 6' (1.829 m)   Body mass index is 38.52 kg/m. No exam data present  Post stroke PHQ 2/9 Depression screen PHQ 2/9 01/29/2020  Decreased Interest 0  Down, Depressed, Hopeless 0  PHQ - 2 Score 0     General: well developed, well nourished,  pleasant middle-age Caucasian male, seated, in no  evident distress Head: head normocephalic and atraumatic.   Neck: supple with no carotid or supraclavicular bruits Cardiovascular: regular rate and rhythm, no murmurs Musculoskeletal: no deformity Skin:  no rash/petichiae Vascular:  Normal pulses all extremities   Neurologic Exam Mental Status: Awake and fully alert.   Fluent speech and language.  Oriented to place and time. Recent and remote memory intact. Attention span, concentration and fund of knowledge appropriate. Mood and affect appropriate.  Cranial Nerves: Fundoscopic exam reveals sharp disc margins. Pupils equal, briskly reactive to light. Extraocular movements full without nystagmus. Visual fields full to confrontation. Hearing intact. Facial sensation intact. Face, tongue, palate moves normally and symmetrically.  Motor: Normal bulk and tone. Normal strength in all tested extremity muscles. Sensory.: intact to touch , pinprick , position and vibratory sensation.  Coordination: Rapid alternating movements normal in all extremities. Finger-to-nose and heel-to-shin performed accurately bilaterally. Gait and Station: Arises from chair without difficulty. Stance is normal. Gait  demonstrates normal stride length and balance without use of assistive device. Reflexes: 1+ and symmetric. Toes downgoing.     NIHSS  0 Modified Rankin  1     ASSESSMENT: Antonio Santos is a 44 y.o. year old male presented initially to [facility] for visual disturbance on 12/28/2019 with MRI negative and was discharged to follow-up with ophthalmology.  That same day during ophthalmology office visit, he developed R gaze, LUE flaccid, LLE weakness and L facial droop.  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 Vascular risk factors include HLD.      PLAN:  1. B/l small punctate medial thalamic infarcts:  a. Residual deficit: Subjective very slight LUE weakness with occasional numbness.  Denies residual visual deficit and has returned back to all prior activities.   b. Continue aspirin 81 mg daily  and atorvastatin for secondary stroke prevention. Close PCP follow up for aggressive stroke risk factor management  2. HTN: BP goal <130/90.  Stable on lisinopril-hydrochlorothiazide 3. HLD: LDL goal <70. Recent LDL 114.  Initiate atorvastatin 40 mg daily during stroke admission.  Request repeat lipid panel at follow-up visit with PCP in 1 month as well as PCP continuing to prescribe and monitor ongoing 4. Suspected sleep apnea: Plans on undergoing sleep study in the near future -currently followed by Dr. Vickey Huger 5. Elevated homocysteine level: Repeat homocystine level and obtain B12 level.  Family history of B12 deficiency.     Follow up in 4 months or call earlier if needed  CC:  [facility] provider: Dr. Elita Boone, Charyl Dancer, MD    I spent 45 minutes of face-to-face and non-face-to-face time with patient and wife.  This included previsit chart review including recent hospitalization pertinent progress notes, lab work and imaging, lab review, study review, order  entry, electronic health record documentation, patient education regarding recent stroke and etiology, residual deficits, abnormal lab work and indication for repeating, importance of managing stroke risk factors and answered all other questions to patient and wife's satisfaction   Ihor Austin, AGNP-BC  [facility] [address] [place], [place] [address]  Phone [phone] Fax [phone] Note: This document was prepared with digital dictation and possible smart phrase technology. Any transcriptional errors that result from this process are unintentional.

## 2020-01-30 NOTE — Progress Notes (Signed)
I agree with the above plan 

## 2020-01-31 ENCOUNTER — Other Ambulatory Visit: Payer: Self-pay | Admitting: Adult Health

## 2020-01-31 ENCOUNTER — Telehealth: Payer: Self-pay

## 2020-01-31 LAB — HOMOCYSTEINE: Homocysteine: 26.5 umol/L — ABNORMAL HIGH (ref 0.0–14.5)

## 2020-01-31 LAB — VITAMIN B12: Vitamin B-12: 738 pg/mL (ref 232–1245)

## 2020-01-31 LAB — PROTEIN C, TOTAL: Protein C Antigen: 173 % — ABNORMAL HIGH (ref 60–150)

## 2020-01-31 LAB — PROTEIN C ACTIVITY: Protein C Activity: >199 % — ABNORMAL HIGH (ref 73–180)

## 2020-01-31 MED ORDER — FOLIC ACID 1 MG PO TABS: 1.0000 mg | ORAL_TABLET | Freq: Every day | ORAL | Status: AC

## 2020-01-31 NOTE — Telephone Encounter (Signed)
Pt verified by name and DOB, results given per provider, pt voiced understanding all question answered. 

## 2020-01-31 NOTE — Telephone Encounter (Signed)
-----   Message from Ihor Austin, NP sent at 01/31/2020  3:26 PM EST ----- Protein S levels remain elevated which typically are not clinically significant as only low levels indicate possible clotting disorder

## 2020-01-31 NOTE — Telephone Encounter (Signed)
-----   Message from Ihor Austin, NP sent at 01/31/2020  1:25 PM EST ----- Please advise patient that his B12 level was within normal range but is also taking B12 supplement and advised to continue.  His homocystine level remains elevated and recommend initiating folic acid 1 mg daily which can be purchased OTC.  We will repeat levels at follow-up visit.  Currently waiting for protein C results.  Thank you.

## 2020-02-27 ENCOUNTER — Ambulatory Visit (INDEPENDENT_AMBULATORY_CARE_PROVIDER_SITE_OTHER): Payer: 59 | Admitting: Neurology

## 2020-02-27 ENCOUNTER — Other Ambulatory Visit: Payer: Self-pay

## 2020-02-27 DIAGNOSIS — G4733 Obstructive sleep apnea (adult) (pediatric): Secondary | ICD-10-CM | POA: Diagnosis not present

## 2020-02-27 DIAGNOSIS — I63331 Cerebral infarction due to thrombosis of right posterior cerebral artery: Secondary | ICD-10-CM

## 2020-02-27 DIAGNOSIS — E669 Obesity, unspecified: Secondary | ICD-10-CM

## 2020-02-27 DIAGNOSIS — R0683 Snoring: Secondary | ICD-10-CM

## 2020-02-27 DIAGNOSIS — H532 Diplopia: Secondary | ICD-10-CM

## 2020-02-27 DIAGNOSIS — R0681 Apnea, not elsewhere classified: Secondary | ICD-10-CM

## 2020-02-28 ENCOUNTER — Encounter: Payer: Self-pay | Admitting: Neurology

## 2020-03-06 ENCOUNTER — Telehealth: Payer: Self-pay | Admitting: Neurology

## 2020-03-06 ENCOUNTER — Encounter: Payer: Self-pay | Admitting: Neurology

## 2020-03-06 NOTE — Telephone Encounter (Signed)
-----   Message from Melvyn Novas, MD sent at 03/06/2020 12:53 PM EST ----- POLYSOMNOGRAPHY IMPRESSION:  Severe Obstructive Sleep Apnea (OSA) at AHI of 93.2/h with severe and prolonged hypoxemia was successfully treated under CPAP of 12 cm water pressure, heated humidification and nasal pillows P10 interface, best results in non-supine sleep. hypoxia was alleviated as well without oxygen being supplemented.   RECOMMENDATIONS: Provision will include an auto CPAP with 2 cm EPR and settings from 4 through 15 cm water pressure, with ResMed P 10 interface of medium nasal pillows, and heated humidification.   Weight loss is still recommended.

## 2020-03-06 NOTE — Procedures (Signed)
PATIENT'S NAME:  Antonio, Santos DOB:      20-Jul-1975      MR#:    [id number]     DATE OF RECORDING: 02/27/2020 B. Evonnie Dawes M.D.:  Delia Heady, MD Study Performed:  Split-Night Titration Study HISTORY:  Antonio Santos is a 44 y.o.  Caucasian male patient seen upon a referral from the STROKE MD service , on 01/25/2020 for consultation. Chief concern according to patient:   I had a stroke and in hospital was witnessed sleep apnea. Sleep relevant medical history: snoring, restricted sleep, CVA 12-2019.   The patient endorsed the Epworth Sleepiness Scale at 6 points   The patient's weight 282 pounds with a height of 72 (inches), resulting in a BMI of 38.2 kg/m2. The patient's neck circumference measured 22 inches.  CURRENT MEDICATIONS: ASA 81mg , Lipitor, Lofibra, Percocet, Vit D, Vut B12, Vit C  PROCEDURE:  This is a multichannel digital polysomnogram utilizing the Somnostar 11.2 system.  Electrodes and sensors were applied and monitored per AASM Specifications.   EEG, EOG, Chin and Limb EMG, were sampled at 200 Hz.  ECG, Snore and Nasal Pressure, Thermal Airflow, Respiratory Effort, CPAP Flow and Pressure, Oximetry was sampled at 50 Hz. Digital video and audio were recorded.      BASELINE STUDY WITHOUT CPAP RESULTS: The patient has been fitted for nasal pillows, Medium size- P10. Lights Out was at 22:38 and Lights On at 04:48.  Total recording time (TRT) was 77.5, with a total sleep time (TST) of 60.5 minutes.   The patient's sleep latency was 14.5 minutes.  REM latency was 0 minutes.  The sleep efficiency was 78.1 %.    SLEEP ARCHITECTURE: WASO (Wake after sleep onset) was 0 minutes, Stage N1 was 8.5 minutes, Stage N2 was 52 minutes, Stage N3 was 0 minutes and Stage R (REM sleep) was 0 minutes.  The percentages were Stage N1 14.%, Stage N2 86.%, Stage N3 0% and Stage R (REM sleep) 0%.   RESPIRATORY ANALYSIS:  There were a total of 94 respiratory events:  0 apneas and 94 hypopneas  with a hypopnea index of 93.2/h. The patient also had few respiratory event related arousals (RERAs).     The total APNEA/HYPOPNEA INDEX (AHI) was 93.2 /hour. REM sleep void sleep architecture- all 188 events in NREM. The patient spent 162.5 minutes sleep time in the supine position 135 minutes in non-supine.  The supine AHI was 93.2 /hour versus a non-supine AHI of 0.0 /hour. Moderate-Loud snoring was noted.  OXYGEN SATURATION & C02:  The wake baseline 02 saturation was 94%, with the nadir of SpO2 being 70%. Time spent below 89% saturation equaled 51 minutes. PERIODIC LIMB MOVEMENTS: The patient had a total of 0 Periodic Limb Movements. The arousals were noted as: 3 were spontaneous, 0 were associated with PLMs, 83 were associated with respiratory events.  TITRATION STUDY WITH CPAP RESULTS: CPAP was initiated at 4 cmH20 with heated humidity per AASM split night standards and pressure was advanced to 12 cmH20 because of hypopneas, apneas and desaturations.  At a PAP pressure of 12 cmH20, there was a reduction of the AHI to 0.0 /hour.   Total recording time (TRT) was 292.5 minutes, with a total sleep time (TST) of 236.5 minutes. The patient's sleep latency was 48.5 minutes. REM latency was 147 minutes.  The sleep efficiency was 80.9 %.    SLEEP ARCHITECTURE: Wake after sleep was 15.5 minutes, Stage N1 19 minutes, Stage N2 140.5 minutes, Stage N3  42.5 minutes and Stage R (REM sleep) 34.5 minutes. The percentages were: Stage N1 8.%, Stage N2 59.4%, Stage N3 18.% and Stage R (REM sleep) 14.6%. The sleep architecture was notable for REM rebounding at 11 cm water CPAP pressure in right lateral sleep.  RESPIRATORY ANALYSIS:  There were a total of 20 respiratory events: 0 obstructive apneas, 0 central apneas and 0 mixed apneas with a total of 0 apneas and an apnea index (AI) of 0. There were 20 hypopneas with a hypopnea index of 5.1 /hour. The patient also had 0 respiratory event related arousals (RERAs).      The total APNEA/HYPOPNEA INDEX (AHI) was 5.1 /hour and the total RESPIRATORY DISTURBANCE INDEX was 5.1 /hour.  0 events occurred in REM sleep and 20 events in NREM. The REM AHI was 0 /hour versus a non-REM AHI of 5.9 /hour. The patient spent 43% of total sleep time in the supine position. The supine AHI was 10.6 /hour, versus a non-supine AHI of 0.9/hour.  OXYGEN SATURATION & C02:  The wake baseline 02 saturation was 93%, with the lowest being 82%. Time spent below 89% saturation equaled 3 minutes. The arousals were noted as: 19 were spontaneous, 0 were associated with PLMs, 12 were associated with respiratory events.  POLYSOMNOGRAPHY IMPRESSION :  Severe Obstructive Sleep Apnea (OSA) at AHI of 93.2/h with severe and prolonged hypoxemia was successfully treated under CPAP of 12 cm water pressure, heated humidification and nasal pillows P10 interface, best results in non-supine sleep. hypoxia was alleviated as well without oxygen being supplemented.   RECOMMENDATIONS: provision will include an auto CPAP with 2 cm EPR and settings from 4 through 15 cm water pressure, with ResMed P 10 interface of medium nasal pillows, and heated humidification.   Weight loss is still recommended.   A follow up appointment will be scheduled in the Sleep Clinic at [facility].    I certify that I have reviewed the entire raw data recording prior to the issuance of this report in accordance with the Standards of Accreditation of the American Academy of Sleep Medicine (AASM).  Melvyn Novas, M.D. Diplomat, Biomedical engineer of Psychiatry and Neurology  Diplomat, Biomedical engineer of Sleep Medicine Wellsite geologist, [employer]

## 2020-03-06 NOTE — Addendum Note (Signed)
Addended by: Melvyn Novas on: 03/06/2020 12:53 PM   Modules accepted: Orders

## 2020-03-06 NOTE — Telephone Encounter (Signed)
I called Antonio Santos. I advised Antonio Santos that Dr. Vickey Huger reviewed their sleep study results and found that Antonio Santos severe sleep apnea. Dr. Vickey Huger recommends that Antonio Santos starts auto CPAP. I reviewed PAP compliance expectations with the Antonio Santos. Antonio Santos is agreeable to starting a CPAP. I advised Antonio Santos that an order will be sent to a DME, [facility] and [facility] will call the Antonio Santos within about one week after they file with the Antonio Santos's insurance. [facility]) will show the Antonio Santos how to use the machine, fit for masks, and troubleshoot the CPAP if needed. A follow up appt will need to be made for insurance purposes with Dr. Vickey Huger or the NP. Antonio Santos verbalized understanding to call the office to schedule the initial CPAP f/u date 31-90 days from the date he picks up machine. A letter with all of this information in it will be sent to the Antonio Santos as a reminder.Antonio Santos verbalized understanding of results. Antonio Santos had no questions at this time but was encouraged to call back if questions arise. I have sent the order to [facility]) and have received confirmation that they have received the order.

## 2020-04-02 ENCOUNTER — Encounter: Payer: Self-pay | Admitting: Neurology

## 2020-05-28 ENCOUNTER — Ambulatory Visit (INDEPENDENT_AMBULATORY_CARE_PROVIDER_SITE_OTHER): Payer: 59 | Admitting: Adult Health

## 2020-05-28 ENCOUNTER — Other Ambulatory Visit: Payer: Self-pay

## 2020-05-28 ENCOUNTER — Encounter: Payer: Self-pay | Admitting: Adult Health

## 2020-05-28 VITALS — BP 143/83 | HR 99 | Ht 72.0 in | Wt 286.0 lb

## 2020-05-28 DIAGNOSIS — Z9989 Dependence on other enabling machines and devices: Secondary | ICD-10-CM

## 2020-05-28 DIAGNOSIS — E538 Deficiency of other specified B group vitamins: Secondary | ICD-10-CM | POA: Diagnosis not present

## 2020-05-28 DIAGNOSIS — I6381 Other cerebral infarction due to occlusion or stenosis of small artery: Secondary | ICD-10-CM

## 2020-05-28 DIAGNOSIS — I639 Cerebral infarction, unspecified: Secondary | ICD-10-CM

## 2020-05-28 DIAGNOSIS — G4733 Obstructive sleep apnea (adult) (pediatric): Secondary | ICD-10-CM | POA: Diagnosis not present

## 2020-05-28 DIAGNOSIS — R7989 Other specified abnormal findings of blood chemistry: Secondary | ICD-10-CM | POA: Diagnosis not present

## 2020-05-28 NOTE — Progress Notes (Signed)
I agree with the above plan 

## 2020-05-28 NOTE — Progress Notes (Signed)
Antonio Santos [address] [place]. [place] [address] [phone]       OFFICE FOLLOW UP NOTE  Mr. Brittian Renaldo [facility] Date of Birth:  Dec 14, 1975 Medical Record Number:  [id number]   Reason for visit: Stroke f/u and initial CPAP compliance visit    SUBJECTIVE:   CHIEF COMPLAINT:  Chief Complaint  Patient presents with  . Follow-up    Rm 14 Pt is well, no complaints on stroke standpoint. Sleeps well with CPAP      HPI:   Today, 05/28/2020, Mr. Michalski returns for scheduled 6-month stroke follow-up as well as initial CPAP compliance visit.  Doing well since prior visit reporting complete recovery without residual stroke deficits. Denies new stroke/TIA symptoms  Compliant on aspirin and atorvastatin -denies related side effects Reports fasting lab work in 04/2020 with PCP - all satisfactory  Blood pressure today 143/83 - monitors and typically 130s/70s  Remains on B12 and folic acid 01/2020 B12 738 and homocysteine 26.5   Followed by Dr. Vickey Huger for sleep apnea with sleep study 12/21 showing severe sleep apnea with total AHI 93.2/h and prolonged hypoxemia.  Titration study showed successful treatment of both apnea and hypoxia under CPAP of 12.  AutoPap initiated and was started on 2/8.  Review of compliance report shows 77% compliance with residual AHI 0.3. he has used 30/30 days but typically sleeps only 3-7 hrs per night.  He has been tolerating CPAP well without difficulty.  He has had difficulty obtaining supplies through [employer].  Reports great improvement of daytime fatigue and energy levels.  ESS: 6 (prior 6) FSS: 23 (prior 19)         History provided for reference purposes only Initial visit 01/29/2020 JM: Mr. Gonyer is being seen for hospital follow-up accompanied by his wife.  He has been doing well since discharge with very slight left arm weakness and occasional short lasting left arm numbness. 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 Denies new or worsening stroke/TIA symptoms.  Hypercoagulable labs largely unremarkable except slightly elevated protein C and elevated homocysteine level during admission and has been taking B12 supplement.  Wife questions ongoing need or correct dosage amount of current B12 supplement. Remains on aspirin 81mg  daily without bleeding or bruising. Remains on atorvastatin 40mg  daily without myalgias. Blood pressure 128/76 with PCP recently initiating lisinopril-hydrochlorothiazide.  He was evaluated by Dr. for possible underlying sleep apnea on 11/18 and currently awaiting to undergo sleep study.  No further concerns at this time.  Stroke admission 12/28/2019 Mr. Carmelo Reidel is a 45 y.o. male with history of obesity, back pain, and hypercholesterolemia who presented on 12/28/2019 at [facility] for visual disturbance, concerning for posterior circulation stroke with reported negative MRI and discharge to follow-up with ophthalmology. At the opthamologist's office he developed R gaze, flacced LUE, LLE weakness and L facial droop. He then presented via EMS to [facility] ED for evaluation.  Personally reviewed hospitalization pertinent progress notes, lab work and imaging with summary provided.  Evaluated by Dr. 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  Recommended DAPT for 3 weeks and aspirin alone.  LDL 114 and initiate atorvastatin 40 mg daily.  No history of DM or HTN.  Other stroke risk factors include suspected OSA and obesity but no prior stroke  history.  Other active problems include AKI and chronic back pain.  Presenting symptoms resolved evaluated by therapies and was discharged home in stable condition without therapy needs.  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 Possible inferior medial R thalamic hypodensity. ASPECTS 10.     CTA head & neck no LVO  CT perfusion no core infarct or penumbra  MRI  Medial R thalamic infarct. Contralateral punctate medial L thalamic infarct.  2D Echo EF 65 to 70%  LDL 114  HgbA1c 5.3  VTE prophylaxis - SCDs   No antithrombotic prior to admission, now on aspirin 81 mg daily and clopidogrel 75 mg daily. Continue DAPT x 3 weeks then aspirin alone  Therapy recommendations:   None  Disposition:   Home       ROS:   14 system review of systems performed and negative with exception of those listed in HPI  PMH: History reviewed. No pertinent past medical history.  PSH: History reviewed. No pertinent surgical history.  Social History:  Social History   Socioeconomic History  . Marital status: Married    Spouse name: Not on file  . Number of children: Not on file  . Years of education: Not on file  . Highest education level: Not on file  Occupational History  . Not on file  Tobacco Use  . Smoking status: Never Smoker  . Smokeless tobacco: Never Used  Substance and Sexual Activity  . Alcohol use: Never  . Drug use: Never  . Sexual activity: Not on file  Other Topics Concern  . Not on file  Social History Narrative  . Not on file   Social Determinants of Health   Financial Resource Strain: Not on file  Food Insecurity: Not on file  Transportation Needs: Not on file  Physical Activity: Not on file  Stress: Not on file  Social Connections: Not on file  Intimate Partner Violence: Not on file    Family History: History reviewed. No pertinent family history.  Medications:   Current Outpatient Medications on File Prior to Visit  Medication Sig Dispense Refill  . Ascorbic Acid (VITAMIN C) 500 MG CAPS Take 500 mg by mouth daily.    Marland Kitchen aspirin EC 81 MG EC tablet  Take 1 tablet (81 mg total) by mouth daily. Swallow whole. 30 tablet 0  . atorvastatin (LIPITOR) 40 MG tablet Take 1 tablet (40 mg total) by mouth daily. 30 tablet 0  . Cholecalciferol (VITAMIN D-3) 125 MCG (5000 UT) TABS Take 1 tablet by mouth daily.    . Cyanocobalamin (VITAMIN B-12) 5000 MCG TBDP Take 500 mcg by mouth daily.    . fenofibrate micronized (LOFIBRA) 200 MG capsule Take 200 mg by mouth daily.     . folic acid (FOLVITE) 1 MG tablet Take 1 tablet (1 mg total) by mouth daily.    Marland Kitchen lisinopril-hydrochlorothiazide (ZESTORETIC) 20-12.5 MG tablet Take 1 tablet by mouth daily.    Marland Kitchen oxyCODONE-acetaminophen (PERCOCET/ROXICET) 5-325 MG tablet Take 1 tablet by mouth 4 (four) times daily as needed.     No current facility-administered medications on file prior to visit.    Allergies:   Allergies  Allergen Reactions  . Penicillins Other (See Comments)    Occurred when he was a child      OBJECTIVE:  Physical Exam  Vitals:   05/28/20 1535  BP: (!) 143/83  Pulse: 99  Weight: 286 lb (129.7 kg)  Height: 6' (1.829 m)   Body mass index is 38.79 kg/m. No exam data present  General: well developed, well nourished,  pleasant middle-age Caucasian male, seated, in no evident distress Head: head normocephalic and atraumatic.   Neck: supple with no carotid or supraclavicular bruits Cardiovascular: regular rate and rhythm, no murmurs Musculoskeletal: no deformity Skin:  no rash/petichiae Vascular:  Normal pulses all extremities   Neurologic Exam Mental Status: Awake and fully alert.   Fluent speech and language.  Oriented to place and time. Recent and remote memory intact. Attention span, concentration and fund of knowledge appropriate. Mood and affect appropriate.  Cranial Nerves: Fundoscopic exam reveals sharp disc margins. Pupils equal, briskly reactive to light. Extraocular movements full without nystagmus. Visual fields full to confrontation. Hearing intact. Facial sensation  intact. Face, tongue, palate moves normally and symmetrically.  Motor: Normal bulk and tone. Normal strength in all tested extremity muscles. Sensory.: intact to touch , pinprick , position and vibratory sensation.  Coordination: Rapid alternating movements normal in all extremities. Finger-to-nose and heel-to-shin performed accurately bilaterally. Gait and Station: Arises from chair without difficulty. Stance is normal. Gait demonstrates normal stride length and balance without use of assistive device. Reflexes: 1+ and symmetric. Toes downgoing.       ASSESSMENT: Zyeir Dymek is a 45 y.o. year old male presented initially to [facility] for visual disturbance on 12/28/2019 with MRI negative and was discharged to follow-up with ophthalmology.  That same day during ophthalmology office visit, he developed R gaze, LUE flaccid, LLE weakness and L facial droop.  He presented to [facility] ED via EMS with stroke work-up revealing R small and L punctate medial thalamic infarcts possibly artery of percheron secondary to small vessel disease source. Vascular risk factors include HLD.      PLAN:  1. B/l small punctate medial thalamic infarcts:  a. Recovered well without residual deficit b. Continue aspirin 81 mg daily  and atorvastatin for secondary stroke prevention.  c. Discussed secondary stroke prevention measures and importance of close PCP follow up for aggressive stroke risk factor management  d. HTN: BP goal <130/90.  Stable on lisinopril-hydrochlorothiazide e. HLD: LDL goal <70.  On atorvastatin 40 mg daily -recent lipid panel obtained by PCP - he will bring labs to office for further review  2. Severe OSA:  a. Initial AHI 93.2 with prolonged hypoxemia per split-night study 02/2020.  b. autoPAP initiated 2/8  c. compliance report showing satisfactory compliance and optimal residual AHI at 0.3.   d. Encouraged continued use of nightly usage and to follow with DME company for any supplies or CPAP  related concerns.   e. Recent difficulty obtaining supplies from [employer] - advised Aundra Millet, RN will reach out to DME company  3. B12 and folate deficiency:  a. Continue B12 supplement and folic acid.  Repeat B12 and folate panel    Follow up in 6 months or call earlier if needed  CC:  [facility] provider: Dr. Elita Boone, Charyl Dancer, MD    I spent 40 minutes of face-to-face and non-face-to-face time with patient and wife.  This included previsit chart review, lab review, study review, order entry, electronic health record documentation, patient education and discussion regarding prior stroke and etiology, B12 and folate deficiency, recent sleep study and initiation of CPAP with reviewing discussion of compliance report, importance of managing stroke risk factors and answered all other questions to patient satisfaction  Frann Rider, AGNP-BC  [facility] [address] [place], [place] [address]  Phone [phone] Fax [phone] Note: This document was prepared with digital dictation and possible smart phrase technology. Any transcriptional errors that result from this process are unintentional.

## 2020-05-29 LAB — B12 AND FOLATE PANEL
Folate: >20 ng/mL (ref >3.0)
Vitamin B-12: >2000 pg/mL — ABNORMAL HIGH (ref 232–1245)

## 2020-06-02 ENCOUNTER — Encounter: Payer: Self-pay | Admitting: Adult Health

## 2020-06-04 NOTE — Telephone Encounter (Signed)
RE: cpap supplies Received: Valente David, Clinton Quant, RN Thank you, will process.   Shanda Bumps      Previous Messages   ----- Message -----  From: Guy Begin, RN  Sent: 06/03/2020  9:29 AM EDT  To: Fannie Knee  Subject: cpap supplies                   Continue current settings 4-15 with EPR level 3  Patient requesting new filter - please contact to further discuss Kolin Erdahl   Male, 45 y.o., Mar 10, 1975   MRN:  [id number]   Thanks Andrey Campanile RN

## 2020-10-07 ENCOUNTER — Ambulatory Visit (INDEPENDENT_AMBULATORY_CARE_PROVIDER_SITE_OTHER): Payer: 59 | Admitting: Internal Medicine

## 2020-10-07 ENCOUNTER — Encounter: Payer: Self-pay | Admitting: Internal Medicine

## 2020-10-07 ENCOUNTER — Other Ambulatory Visit: Payer: Self-pay

## 2020-10-07 VITALS — BP 166/78 | HR 96 | Temp 98.7°F | Resp 16 | Ht 72.0 in | Wt 290.0 lb

## 2020-10-07 DIAGNOSIS — I10 Essential (primary) hypertension: Secondary | ICD-10-CM

## 2020-10-07 DIAGNOSIS — Z9989 Dependence on other enabling machines and devices: Secondary | ICD-10-CM

## 2020-10-07 DIAGNOSIS — R9431 Abnormal electrocardiogram [ECG] [EKG]: Secondary | ICD-10-CM | POA: Insufficient documentation

## 2020-10-07 DIAGNOSIS — I63331 Cerebral infarction due to thrombosis of right posterior cerebral artery: Secondary | ICD-10-CM

## 2020-10-07 DIAGNOSIS — E669 Obesity, unspecified: Secondary | ICD-10-CM

## 2020-10-07 DIAGNOSIS — D649 Anemia, unspecified: Secondary | ICD-10-CM

## 2020-10-07 DIAGNOSIS — H532 Diplopia: Secondary | ICD-10-CM

## 2020-10-07 DIAGNOSIS — G4733 Obstructive sleep apnea (adult) (pediatric): Secondary | ICD-10-CM

## 2020-10-07 LAB — CBC WITH DIFFERENTIAL/PLATELET
Basophils Absolute: 0 10*3/uL (ref 0.0–0.1)
Basophils Relative: 0.6 % (ref 0.0–3.0)
Eosinophils Absolute: 0.1 10*3/uL (ref 0.0–0.7)
Eosinophils Relative: 1.5 % (ref 0.0–5.0)
HCT: 38.7 % — ABNORMAL LOW (ref 39.0–52.0)
Hemoglobin: 13.4 g/dL (ref 13.0–17.0)
Lymphocytes Relative: 23.7 % (ref 12.0–46.0)
Lymphs Abs: 1.5 10*3/uL (ref 0.7–4.0)
MCHC: 34.6 g/dL (ref 30.0–36.0)
MCV: 91.2 fl (ref 78.0–100.0)
Monocytes Absolute: 0.5 10*3/uL (ref 0.1–1.0)
Monocytes Relative: 8.4 % (ref 3.0–12.0)
Neutro Abs: 4.2 10*3/uL (ref 1.4–7.7)
Neutrophils Relative %: 65.8 % (ref 43.0–77.0)
Platelets: 211 10*3/uL (ref 150.0–400.0)
RBC: 4.25 Mil/uL (ref 4.22–5.81)
RDW: 12.2 % (ref 11.5–15.5)
WBC: 6.4 10*3/uL (ref 4.0–10.5)

## 2020-10-07 LAB — HEPATIC FUNCTION PANEL
ALT: 30 U/L (ref 0–53)
AST: 29 U/L (ref 0–37)
Albumin: 4.8 g/dL (ref 3.5–5.2)
Alkaline Phosphatase: 42 U/L (ref 39–117)
Bilirubin, Direct: 0.1 mg/dL (ref 0.0–0.3)
Total Bilirubin: 0.6 mg/dL (ref 0.2–1.2)
Total Protein: 7.7 g/dL (ref 6.0–8.3)

## 2020-10-07 LAB — BASIC METABOLIC PANEL
BUN: 21 mg/dL (ref 6–23)
CO2: 27 mEq/L (ref 19–32)
Calcium: 9.9 mg/dL (ref 8.4–10.5)
Chloride: 101 mEq/L (ref 96–112)
Creatinine, Ser: 1.56 mg/dL — ABNORMAL HIGH (ref 0.40–1.50)
GFR: 52.91 mL/min — ABNORMAL LOW (ref >60.00)
Glucose, Bld: 99 mg/dL (ref 70–99)
Potassium: 3.9 mEq/L (ref 3.5–5.1)
Sodium: 138 mEq/L (ref 135–145)

## 2020-10-07 LAB — VITAMIN D 25 HYDROXY (VIT D DEFICIENCY, FRACTURES): VITD: 35.66 ng/mL (ref 30.00–100.00)

## 2020-10-07 MED ORDER — EDARBI 80 MG PO TABS: 1.0000 | ORAL_TABLET | Freq: Every day | ORAL | 0 refills | Status: DC

## 2020-10-07 MED ORDER — INDAPAMIDE 2.5 MG PO TABS: 2.5000 mg | ORAL_TABLET | Freq: Every day | ORAL | 0 refills | Status: DC

## 2020-10-07 MED ORDER — NEBIVOLOL HCL 5 MG PO TABS: 5.0000 mg | ORAL_TABLET | Freq: Every day | ORAL | 0 refills | Status: DC

## 2020-10-07 NOTE — Patient Instructions (Signed)

## 2020-10-07 NOTE — Progress Notes (Signed)
Subjective:  Patient ID: Antonio Santos, male    DOB: 09/17/1975  Age: 45 y.o. MRN: [id number]  CC: Hypertension  This visit occurred during the SARS-CoV-2 public health emergency.  Safety protocols were in place, including screening questions prior to the visit, additional usage of staff PPE, and extensive cleaning of exam room while observing appropriate contact time as indicated for disinfecting solutions.    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  He is concerned that his blood pressure is not well controlled.  He was recently told by his neurologist that his blood pressure should be around 130/80.  He is compliant with the current antihypertensives.  He denies headache, chest pain, shortness of breath, diaphoresis, dizziness, or lightheadedness.  Outpatient Medications Prior to Visit  Medication Sig Dispense Refill   Ascorbic Acid (VITAMIN C) 500 MG CAPS Take 500 mg by mouth daily.     aspirin EC 81 MG EC tablet Take 1 tablet (81 mg total) by mouth daily. Swallow whole. 30 tablet 0   atorvastatin (LIPITOR) 40 MG tablet Take 1 tablet (40 mg total) by mouth daily. 30 tablet 0   Cholecalciferol (VITAMIN D-3) 125 MCG (5000 UT) TABS Take 1 tablet by mouth daily.     Cyanocobalamin (VITAMIN B-12) 5000 MCG TBDP Take 500 mcg by mouth daily.     fenofibrate micronized (LOFIBRA) 200 MG capsule Take 200 mg by mouth daily.      folic acid (FOLVITE) 1 MG tablet Take 1 tablet (1 mg total) by mouth daily.     oxyCODONE-acetaminophen (PERCOCET/ROXICET) 5-325 MG tablet Take 1 tablet by mouth 4 (four) times daily as needed.     lisinopril-hydrochlorothiazide (ZESTORETIC) 20-12.5 MG tablet Take 1 tablet by mouth daily.     No facility-administered medications prior to visit.    ROS Review of Systems  Constitutional:  Negative for diaphoresis and fatigue.  HENT: Negative.    Eyes:  Negative for photophobia and visual disturbance.   Respiratory:  Positive for apnea. Negative for cough, chest tightness, shortness of breath and wheezing.   Cardiovascular:  Negative for chest pain, palpitations and leg swelling.  Gastrointestinal:  Negative for abdominal pain, constipation, diarrhea, nausea and vomiting.  Genitourinary: Negative.  Negative for difficulty urinating, dysuria and hematuria.  Musculoskeletal: Negative.  Negative for arthralgias, back pain, myalgias and neck pain.  Skin: Negative.   Neurological: Negative.  Negative for dizziness, syncope, speech difficulty, weakness, light-headedness and headaches.  Hematological:  Negative for adenopathy. Does not bruise/bleed easily.  Psychiatric/Behavioral: Negative.     Objective:  BP (!) 166/78 (BP Location: Left Arm, Patient Position: Sitting, Cuff Size: Large)   Pulse 96   Temp 98.7 F (37.1 C) (Oral)   Resp 16   Ht 6' (1.829 m)   Wt 290 lb (131.5 kg)   SpO2 97%   BMI 39.33 kg/m   BP Readings from Last 3 Encounters:  10/07/20 (!) 166/78  05/28/20 (!) 143/83  01/29/20 128/76    Wt Readings from Last 3 Encounters:  10/07/20 290 lb (131.5 kg)  05/28/20 286 lb (129.7 kg)  01/29/20 284 lb (128.8 kg)    Physical Exam Constitutional:      Appearance: He is obese.  HENT:     Nose: Nose normal.     Mouth/Throat:     Mouth: Mucous membranes are moist.  Eyes:     Conjunctiva/sclera: Conjunctivae normal.  Pupils: Pupils are equal, round, and reactive to light.  Cardiovascular:     Rate and Rhythm: Regular rhythm. Tachycardia present.     Heart sounds: No murmur heard.    Comments: EKG- NSR, 99 bpm Q waves in inf leads - much more pronounced in III than was seen previously No LVH Pulmonary:     Effort: Pulmonary effort is normal.     Breath sounds: No stridor. No wheezing, rhonchi or rales.  Abdominal:     General: Abdomen is protuberant.     Palpations: There is no mass.     Tenderness: There is no abdominal tenderness. There is no guarding.      Hernia: No hernia is present.  Musculoskeletal:     Cervical back: Neck supple.     Right lower leg: No edema.     Left lower leg: No edema.  Lymphadenopathy:     Cervical: No cervical adenopathy.  Skin:    General: Skin is warm and dry.  Neurological:     General: No focal deficit present.     Mental Status: He is alert.  Psychiatric:        Mood and Affect: Mood normal.        Behavior: Behavior normal.    Lab Results  Component Value Date   WBC 6.4 10/07/2020   HGB 13.4 10/07/2020   HCT 38.7 (L) 10/07/2020   PLT 211.0 10/07/2020   GLUCOSE 99 10/07/2020   CHOL 218 (H) 12/29/2019   TRIG 286 (H) 12/29/2019   HDL 47 12/29/2019   LDLCALC 114 (H) 12/29/2019   ALT 30 10/07/2020   AST 29 10/07/2020   NA 138 10/07/2020   K 3.9 10/07/2020   CL 101 10/07/2020   CREATININE 1.56 (H) 10/07/2020   BUN 21 10/07/2020   CO2 27 10/07/2020   INR 1.0 12/29/2019   HGBA1C 5.3 12/29/2019    MR ANGIO HEAD WO CONTRAST  Result Date: 12/28/2019 CLINICAL DATA:  Neuro deficit, acute stroke suspected. Acute double vision. EXAM: MRI HEAD WITHOUT AND WITH CONTRAST MRA HEAD WITHOUT CONTRAST TECHNIQUE: Multiplanar, multiecho pulse sequences of the brain and surrounding structures were obtained without and with intravenous contrast. Angiographic images of the Circle of Willis were obtained using MRA technique without intravenous contrast. CONTRAST:  10mL GADAVIST GADOBUTROL 1 MMOL/ML IV SOLN COMPARISON:  CT head from the same day FINDINGS: MRI HEAD FINDINGS Brain: No acute infarction, hemorrhage, hydrocephalus, extra-axial collection or mass lesion. Apparent DWI hyperintensity in the medial right thalamus is favored artifactual given apparent linear artifact through this region. No abnormal enhancement. Skull and upper cervical spine: Normal marrow signal. Sinuses/Orbits: Mild scattered ethmoid air cell mucosal thickening. Unremarkable orbits. Other: No mastoid effusions. MRA HEAD FINDINGS No evidence of  significant (greater than 50%) stenosis or proximal large vessel occlusion. No aneurysm. Diminutive right vertebral artery (likely non dominant) which appears to terminate as PICA. Bilateral posterior communicating arteries. IMPRESSION: 1. No evidence of acute intracranial abnormality. Specifically, no acute infarct. 2. No hemodynamically significant stenosis or proximal large vessel occlusion. Electronically Signed   By: Feliberto Harts MD   On: 12/28/2019 11:49   MR BRAIN WO CONTRAST  Result Date: 12/28/2019 CLINICAL DATA:  Follow-up examination for acute stroke. EXAM: MRI HEAD WITHOUT CONTRAST TECHNIQUE: Multiplanar, multiecho pulse sequences of the brain and surrounding structures were obtained without intravenous contrast. COMPARISON:  Prior CTA from earlier the same day. FINDINGS: Brain: Examination moderately degraded by motion artifact. Cerebral volume within normal limits for  age. No significant cerebral white matter disease. 11 mm acute ischemic infarct seen involving the medial right thalamus (series 3, image 26). Additionally, there is a subtle patchy 4 mm ischemic infarct seen involving the contralateral medial left thalamus, only seen on coronal DWI sequence (series 6, image 20). No associated hemorrhage or mass effect. Finding raises the suspicion for an artery of [place] infarct. No other diffusion abnormality to suggest acute or subacute ischemia. Gray-white matter differentiation otherwise maintained. No encephalomalacia to suggest chronic cortical infarction elsewhere within the brain. No visible foci of susceptibility artifact to suggest acute or chronic intracranial hemorrhage. No mass lesion, midline shift or mass effect. No hydrocephalus or extra-axial fluid collection. Incidental note made of a partially empty sella. Suprasellar region normal. Midline structures intact. Vascular: Major intracranial vascular flow voids are maintained. No findings to suggest dural sinus thrombosis of  the deep cerebral veins. Skull and upper cervical spine: Craniocervical junction within normal limits. Upper cervical spine normal. Bone marrow signal intensity within normal limits. No scalp soft tissue abnormality. Sinuses/Orbits: Globes and orbital soft tissues within normal limits. Mild scattered mucosal thickening noted within the ethmoidal air cells. Paranasal sinuses are otherwise clear. No mastoid effusion. Inner ear structures grossly normal. Other: None. IMPRESSION: 1. 11 mm acute ischemic nonhemorrhagic medial right thalamic infarct. 2. Additional subtle 4 mm ischemic nonhemorrhagic infarct involving the contralateral medial left thalamus. Constellation of findings suspicious for an artery of P[place]heron infarction. 3. No other acute intracranial abnormality. Electronically Signed   By: Rise Mu M.D.   On: 12/28/2019 16:44   MR BRAIN W WO CONTRAST  Result Date: 12/28/2019 CLINICAL DATA:  Neuro deficit, acute stroke suspected. Acute double vision. EXAM: MRI HEAD WITHOUT AND WITH CONTRAST MRA HEAD WITHOUT CONTRAST TECHNIQUE: Multiplanar, multiecho pulse sequences of the brain and surrounding structures were obtained without and with intravenous contrast. Angiographic images of the Circle of Willis were obtained using MRA technique without intravenous contrast. CONTRAST:  10mL GADAVIST GADOBUTROL 1 MMOL/ML IV SOLN COMPARISON:  CT head from the same day FINDINGS: MRI HEAD FINDINGS Brain: No acute infarction, hemorrhage, hydrocephalus, extra-axial collection or mass lesion. Apparent DWI hyperintensity in the medial right thalamus is favored artifactual given apparent linear artifact through this region. No abnormal enhancement. Skull and upper cervical spine: Normal marrow signal. Sinuses/Orbits: Mild scattered ethmoid air cell mucosal thickening. Unremarkable orbits. Other: No mastoid effusions. MRA HEAD FINDINGS No evidence of significant (greater than 50%) stenosis or proximal large vessel  occlusion. No aneurysm. Diminutive right vertebral artery (likely non dominant) which appears to terminate as PICA. Bilateral posterior communicating arteries. IMPRESSION: 1. No evidence of acute intracranial abnormality. Specifically, no acute infarct. 2. No hemodynamically significant stenosis or proximal large vessel occlusion. Electronically Signed   By: Feliberto Harts MD   On: 12/28/2019 11:49   CT CEREBRAL PERFUSION W CONTRAST  Result Date: 12/28/2019 CLINICAL DATA:  Left-sided facial droop EXAM: CT ANGIOGRAPHY HEAD AND NECK CT PERFUSION BRAIN TECHNIQUE: Multidetector CT imaging of the head and neck was performed using the standard protocol during bolus administration of intravenous contrast. Multiplanar CT image reconstructions and MIPs were obtained to evaluate the vascular anatomy. Carotid stenosis measurements (when applicable) are obtained utilizing NASCET criteria, using the distal internal carotid diameter as the denominator. Multiphase CT imaging of the brain was performed following IV bolus contrast injection. Subsequent parametric perfusion maps were calculated using RAPID software. CONTRAST:  OMNIPAQUE IOHEXOL 350 MG/ML SOLN COMPARISON:  None. FINDINGS: CTA NECK FINDINGS Aortic arch: [place]  vessel origins are patent. Right carotid system: Patent. No measurable stenosis at the ICA origin. Left carotid system: Patent. No measurable stenosis at the ICA origin. Vertebral arteries: Patent.  Left vertebral artery is dominant. Skeleton: Unremarkable. Other neck: No mass or adenopathy. Upper chest: No apical lung mass. Review of the MIP images confirms the above findings CTA HEAD FINDINGS Anterior circulation: Intracranial internal carotid arteries are patent. Anterior and middle cerebral arteries are patent. Posterior circulation: Intracranial vertebral arteries are patent. Right vertebral artery terminates as a PICA. Basilar artery is patent. PICA, AICA, and superior cerebellar artery origins  are patent. Posterior cerebral arteries are patent. There are bilateral posterior communicating arteries present. Venous sinuses: As permitted by contrast timing, patent. Review of the MIP images confirms the above findings CT Brain Perfusion Findings: CBF (<30%) Volume: 0mL Perfusion (Tmax>6.0s) volume: 5mL, which is likely artifactual Mismatch Volume: 5mL, which is likely artifactual Infarction Location: None IMPRESSION: No large vessel occlusion or hemodynamically significant stenosis. No evidence of core infarction or penumbra on perfusion imaging. Findings were discussed with Dr. Wilford Corner at the time of study completion. Electronically Signed   By: Guadlupe Spanish M.D.   On: 12/28/2019 15:51   ECHOCARDIOGRAM COMPLETE  Result Date: 12/29/2019    ECHOCARDIOGRAM REPORT   Patient Name:   STARR ENGEL Date of Exam: 12/29/2019 Medical Rec #:  [id number]     Height:       72.0 in Accession #:    [id number]    Weight:       287.5 lb Date of Birth:  1975-09-16      BSA:          2.486 m Patient Age:    44 years      BP:           142/57 mmHg Patient Gender: M             HR:           82 bpm. Exam Location:  Inpatient Procedure: 2D Echo Indications:   stroke 434.91  History:       Patient has no prior history of Echocardiogram examinations.                Stroke.  Sonographer:   Delcie Roch Referring      [id number] Marvel Plan Phys: IMPRESSIONS  1. Left ventricular ejection fraction, by estimation, is 65 to 70%. The left ventricle has normal function. The left ventricle has no regional wall motion abnormalities. Left ventricular diastolic parameters were normal.  2. Right ventricular systolic function is normal. The right ventricular size is normal. Tricuspid regurgitation signal is inadequate for assessing PA pressure.  3. The mitral valve is normal in structure. No evidence of mitral valve regurgitation. No evidence of mitral stenosis.  4. The aortic valve is normal in structure. Aortic valve regurgitation is mild.  No aortic stenosis is present.  5. Aortic dilatation noted. There is mild dilatation of the aortic root, measuring 38 mm.  6. The inferior vena cava is dilated in size with >50% respiratory variability, suggesting right atrial pressure of 8 mmHg. FINDINGS  Left Ventricle: Left ventricular ejection fraction, by estimation, is 65 to 70%. The left ventricle has normal function. The left ventricle has no regional wall motion abnormalities. The left ventricular internal cavity size was normal in size. There is  no left ventricular hypertrophy. Left ventricular diastolic parameters were normal. Normal left ventricular filling pressure. Right Ventricle: The right ventricular size is normal. No  increase in right ventricular wall thickness. Right ventricular systolic function is normal. Tricuspid regurgitation signal is inadequate for assessing PA pressure. Left Atrium: Left atrial size was normal in size. Right Atrium: Right atrial size was normal in size. Pericardium: There is no evidence of pericardial effusion. Mitral Valve: The mitral valve is normal in structure. No evidence of mitral valve regurgitation. No evidence of mitral valve stenosis. Tricuspid Valve: The tricuspid valve is normal in structure. Tricuspid valve regurgitation is not demonstrated. No evidence of tricuspid stenosis. Aortic Valve: The aortic valve is normal in structure. Aortic valve regurgitation is mild. No aortic stenosis is present. Pulmonic Valve: The pulmonic valve was normal in structure. Pulmonic valve regurgitation is not visualized. No evidence of pulmonic stenosis. Aorta: The aortic root is normal in size and structure and aortic dilatation noted. There is mild dilatation of the aortic root, measuring 38 mm. Venous: The inferior vena cava is dilated in size with greater than 50% respiratory variability, suggesting right atrial pressure of 8 mmHg. IAS/Shunts: No atrial level shunt detected by color flow Doppler.  LEFT VENTRICLE PLAX 2D  LVIDd:         4.90 cm  Diastology LVIDs:         3.37 cm  LV e' medial:    7.83 cm/s LV PW:         1.09 cm  LV E/e' medial:  9.0 LV IVS:        1.08 cm  LV e' lateral:   11.90 cm/s LVOT diam:     2.20 cm  LV E/e' lateral: 5.9 LV SV:         103 LV SV Index:   41 LVOT Area:     3.80 cm  RIGHT VENTRICLE             IVC RV S prime:     16.40 cm/s  IVC diam: 2.23 cm LEFT ATRIUM             Index LA diam:        3.60 cm 1.45 cm/m LA Vol (A2C):   32.3 ml 12.99 ml/m LA Vol (A4C):   45.1 ml 18.14 ml/m LA Biplane Vol: 40.2 ml 16.17 ml/m  AORTIC VALVE LVOT Vmax:   148.00 cm/s LVOT Vmean:  108.000 cm/s LVOT VTI:    0.270 m  AORTA Ao Root diam: 3.80 cm MITRAL VALVE MV Area (PHT): 3.21 cm    SHUNTS MV Decel Time: 236 msec    Systemic VTI:  0.27 m MV E velocity: 70.80 cm/s  Systemic Diam: 2.20 cm MV A velocity: 83.40 cm/s MV E/A ratio:  0.85 Armanda Magic MD Electronically signed by Armanda Magic MD Signature Date/Time: 12/29/2019/1:43:28 PM    Final    CT HEAD CODE STROKE WO CONTRAST  Result Date: 12/28/2019 CLINICAL DATA:  Code stroke.  Left facial droop and arm weakness EXAM: CT HEAD WITHOUT CONTRAST TECHNIQUE: Contiguous axial images were obtained from the base of the skull through the vertex without intravenous contrast. COMPARISON:  Earlier same day FINDINGS: Brain: No acute intracranial hemorrhage, mass effect, or edema. No new loss of gray-white differentiation. Possible small area hypoattenuation in the inferior medial right thalamus (series 3, image 17). There is corresponding mild diffusion hyperintensity on the prior MRI. Ventricles are stable in size. Vascular: No new hyperdense vessel. Skull: Unremarkable. Sinuses/Orbits: No acute abnormality. Other: None. ASPECTS [facility] Stroke Program Early CT Score) - Ganglionic level infarction (caudate, lentiform nuclei, internal capsule, insula, M1-M3 cortex):  7 - Supraganglionic infarction (M4-M6 cortex): 3 Total score (0-10 with 10 being normal): 10 IMPRESSION:  No acute intracranial hemorrhage. Possible small area of acute infarction in the inferior medial right thalamus, noting corresponding mild diffusion hyperintensity on the prior MRI These results were communicated to Dr. Wilford Corner at 3:05 pm on 12/28/2019 by text page via the [facility] messaging system. Electronically Signed   By: Guadlupe Spanish M.D.   On: 12/28/2019 15:15   CT HEAD CODE STROKE WO CONTRAST  Result Date: 12/28/2019 CLINICAL DATA:  Code stroke.  Neuro deficit EXAM: CT HEAD WITHOUT CONTRAST TECHNIQUE: Contiguous axial images were obtained from the base of the skull through the vertex without intravenous contrast. COMPARISON:  None. FINDINGS: Brain: No acute infarct or intracranial hemorrhage. No mass lesion. No midline shift, ventriculomegaly or extra-axial fluid collection. Vascular: No hyperdense vessel or unexpected calcification. Skull: Negative for fracture or focal lesion. Sinuses/Orbits: Normal orbits. Clear paranasal sinuses. No mastoid effusion. Other: None. ASPECTS [facility] Stroke Program Early CT Score) - Ganglionic level infarction (caudate, lentiform nuclei, internal capsule, insula, M1-M3 cortex): 7 - Supraganglionic infarction (M4-M6 cortex): 3 Total score (0-10 with 10 being normal): 10 IMPRESSION: 1. No acute intracranial process. 2. ASPECTS is 10 Code stroke imaging results were communicated on 12/28/2019 at 9:07 am to provider Dr. Derrill Kay via choose Electronically Signed   By: Stana Bunting M.D.   On: 12/28/2019 09:13   CT ANGIO HEAD CODE STROKE  Result Date: 12/28/2019 CLINICAL DATA:  Left-sided facial droop EXAM: CT ANGIOGRAPHY HEAD AND NECK CT PERFUSION BRAIN TECHNIQUE: Multidetector CT imaging of the head and neck was performed using the standard protocol during bolus administration of intravenous contrast. Multiplanar CT image reconstructions and MIPs were obtained to evaluate the vascular anatomy. Carotid stenosis measurements (when applicable) are obtained utilizing  NASCET criteria, using the distal internal carotid diameter as the denominator. Multiphase CT imaging of the brain was performed following IV bolus contrast injection. Subsequent parametric perfusion maps were calculated using RAPID software. CONTRAST:  OMNIPAQUE IOHEXOL 350 MG/ML SOLN COMPARISON:  None. FINDINGS: CTA NECK FINDINGS Aortic arch: [place] vessel origins are patent. Right carotid system: Patent. No measurable stenosis at the ICA origin. Left carotid system: Patent. No measurable stenosis at the ICA origin. Vertebral arteries: Patent.  Left vertebral artery is dominant. Skeleton: Unremarkable. Other neck: No mass or adenopathy. Upper chest: No apical lung mass. Review of the MIP images confirms the above findings CTA HEAD FINDINGS Anterior circulation: Intracranial internal carotid arteries are patent. Anterior and middle cerebral arteries are patent. Posterior circulation: Intracranial vertebral arteries are patent. Right vertebral artery terminates as a PICA. Basilar artery is patent. PICA, AICA, and superior cerebellar artery origins are patent. Posterior cerebral arteries are patent. There are bilateral posterior communicating arteries present. Venous sinuses: As permitted by contrast timing, patent. Review of the MIP images confirms the above findings CT Brain Perfusion Findings: CBF (<30%) Volume: 27mL Perfusion (Tmax>6.0s) volume: 65mL, which is likely artifactual Mismatch Volume: 56mL, which is likely artifactual Infarction Location: None IMPRESSION: No large vessel occlusion or hemodynamically significant stenosis. No evidence of core infarction or penumbra on perfusion imaging. Findings were discussed with Dr. Wilford Corner at the time of study completion. Electronically Signed   By: Guadlupe Spanish M.D.   On: 12/28/2019 15:51   CT ANGIO NECK CODE STROKE  Result Date: 12/28/2019 CLINICAL DATA:  Left-sided facial droop EXAM: CT ANGIOGRAPHY HEAD AND NECK CT PERFUSION BRAIN TECHNIQUE: Multidetector  CT imaging of the head and neck  was performed using the standard protocol during bolus administration of intravenous contrast. Multiplanar CT image reconstructions and MIPs were obtained to evaluate the vascular anatomy. Carotid stenosis measurements (when applicable) are obtained utilizing NASCET criteria, using the distal internal carotid diameter as the denominator. Multiphase CT imaging of the brain was performed following IV bolus contrast injection. Subsequent parametric perfusion maps were calculated using RAPID software. CONTRAST:  100mL OMNIPAQUE IOHEXOL 350 MG/ML SOLN COMPARISON:  None. FINDINGS: CTA NECK FINDINGS Aortic arch: Great vessel origins are patent. Right carotid system: Patent. No measurable stenosis at the ICA origin. Left carotid system: Patent. No measurable stenosis at the ICA origin. Vertebral arteries: Patent.  Left vertebral artery is dominant. Skeleton: Unremarkable. Other neck: No mass or adenopathy. Upper chest: No apical lung mass. Review of the MIP images confirms the above findings CTA HEAD FINDINGS Anterior circulation: Intracranial internal carotid arteries are patent. Anterior and middle cerebral arteries are patent. Posterior circulation: Intracranial vertebral arteries are patent. Right vertebral artery terminates as a PICA. Basilar artery is patent. PICA, AICA, and superior cerebellar artery origins are patent. Posterior cerebral arteries are patent. There are bilateral posterior communicating arteries present. Venous sinuses: As permitted by contrast timing, patent. Review of the MIP images confirms the above findings CT Brain Perfusion Findings: CBF (<30%) Volume: 0mL Perfusion (Tmax>6.0s) volume: 5mL, which is likely artifactual Mismatch Volume: 5mL, which is likely artifactual Infarction Location: None IMPRESSION: No large vessel occlusion or hemodynamically significant stenosis. No evidence of core infarction or penumbra on perfusion imaging. Findings were discussed with  Dr. Wilford CornerArora at the time of study completion. Electronically Signed   By: Guadlupe SpanishPraneil  Patel M.D.   On: 12/28/2019 15:51    Assessment & Plan:   Barbara CowerJason was seen today for hypertension.  Diagnoses and all orders for this visit:  Primary hypertension- His blood pressure is not adequately well controlled.  Will check labs to screen for secondary causes and endorgan damage.  I recommended that he change from an ACE inhibitor to an ARB and upgrade to a more potent thiazide diuretic.  Will add a beta-blocker. -     Basic metabolic panel; Future -     Aldosterone + renin activity w/ ratio; Future -     Urinalysis, Routine w reflex microscopic; Future -     VITAMIN D 25 Hydroxy (Vit-D Deficiency, Fractures); Future -     Hepatic function panel; Future -     Thyroid Panel With TSH; Future -     CBC with Differential/Platelet; Future -     nebivolol (BYSTOLIC) 5 MG tablet; Take 1 tablet (5 mg total) by mouth daily. -     indapamide (LOZOL) 2.5 MG tablet; Take 1 tablet (2.5 mg total) by mouth daily. -     Azilsartan Medoxomil (EDARBI) 80 MG TABS; Take 1 tablet (80 mg total) by mouth daily. -     EKG 12-Lead -     CBC with Differential/Platelet -     Thyroid Panel With TSH -     Hepatic function panel -     VITAMIN D 25 Hydroxy (Vit-D Deficiency, Fractures) -     Urinalysis, Routine w reflex microscopic -     Aldosterone + renin activity w/ ratio -     Basic metabolic panel  Obesity (BMI 35.0-39.9 without comorbidity)- Will check labs to screen for secondary causes and endorgan damage. -     Thyroid Panel With TSH; Future -     Thyroid Panel With TSH  Abnormal electrocardiogram (ECG) (EKG)- I have asked him to undergo an MPI to see if the Q waves are consistent with an infarction. 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 Myricks's lisinopril-hydrochlorothiazide. I am also having him start on nebivolol, indapamide, and Edarbi. Additionally, I am having him maintain his fenofibrate micronized, oxyCODONE-acetaminophen, aspirin, atorvastatin, Vitamin D-3, Vitamin C, Vitamin B-12, and folic acid.  Meds ordered this encounter  Medications   nebivolol (BYSTOLIC) 5 MG tablet    Sig: Take 1 tablet (5 mg total) by mouth daily.    Dispense:  30 tablet    Refill:  0   indapamide (LOZOL) 2.5 MG tablet    Sig: Take 1 tablet (2.5 mg total) by mouth daily.    Dispense:  30 tablet    Refill:  0   Azilsartan Medoxomil (EDARBI) 80 MG TABS    Sig: Take 1 tablet (80 mg total) by mouth daily.    Dispense:  30 tablet    Refill:  0      Follow-up: Return in about 4 weeks (around 11/04/2020).  Sanda Linger, MD

## 2020-10-08 DIAGNOSIS — D539 Nutritional anemia, unspecified: Secondary | ICD-10-CM | POA: Insufficient documentation

## 2020-10-08 DIAGNOSIS — D649 Anemia, unspecified: Secondary | ICD-10-CM | POA: Insufficient documentation

## 2020-10-08 DIAGNOSIS — G4733 Obstructive sleep apnea (adult) (pediatric): Secondary | ICD-10-CM | POA: Insufficient documentation

## 2020-10-08 DIAGNOSIS — R9431 Abnormal electrocardiogram [ECG] [EKG]: Secondary | ICD-10-CM | POA: Insufficient documentation

## 2020-10-08 LAB — URINALYSIS, ROUTINE W REFLEX MICROSCOPIC
Bilirubin Urine: NEGATIVE
Hgb urine dipstick: NEGATIVE
Ketones, ur: NEGATIVE
Leukocytes,Ua: NEGATIVE
Nitrite: NEGATIVE
RBC / HPF: NONE SEEN (ref 0–?)
Specific Gravity, Urine: 1.01 (ref 1.000–1.030)
Total Protein, Urine: NEGATIVE
Urine Glucose: NEGATIVE
Urobilinogen, UA: 0.2 (ref 0.0–1.0)
WBC, UA: NONE SEEN (ref 0–?)
pH: 6 (ref 5.0–8.0)

## 2020-10-09 ENCOUNTER — Encounter: Payer: Self-pay | Admitting: Internal Medicine

## 2020-10-10 ENCOUNTER — Encounter: Payer: Self-pay | Admitting: Internal Medicine

## 2020-10-11 ENCOUNTER — Encounter (HOSPITAL_COMMUNITY): Payer: Self-pay | Admitting: Internal Medicine

## 2020-10-11 ENCOUNTER — Telehealth (HOSPITAL_COMMUNITY): Payer: Self-pay

## 2020-10-11 NOTE — Telephone Encounter (Signed)
Close encounter 

## 2020-10-15 ENCOUNTER — Other Ambulatory Visit: Payer: Self-pay

## 2020-10-15 ENCOUNTER — Ambulatory Visit (HOSPITAL_COMMUNITY)
Admission: RE | Admit: 2020-10-15 | Discharge: 2020-10-15 | Disposition: A | Discharge status: Home / Self Care | Payer: 59 | Source: Ambulatory Visit | Attending: Cardiology | Admitting: Cardiology

## 2020-10-15 DIAGNOSIS — R9431 Abnormal electrocardiogram [ECG] [EKG]: Secondary | ICD-10-CM | POA: Diagnosis present

## 2020-10-15 MED ORDER — TECHNETIUM TC 99M TETROFOSMIN IV KIT
28.6000 | PACK | Freq: Once | INTRAVENOUS | Status: AC | PRN
  Administered 2020-10-15: 28.6 via INTRAVENOUS
  Filled 2020-10-15: qty 29

## 2020-10-15 MED ORDER — REGADENOSON 0.4 MG/5ML IV SOLN
0.4000 mg | Freq: Once | INTRAVENOUS | Status: AC
  Administered 2020-10-15: 0.4 mg via INTRAVENOUS

## 2020-10-16 ENCOUNTER — Ambulatory Visit (HOSPITAL_COMMUNITY)
Admission: RE | Admit: 2020-10-16 | Discharge: 2020-10-16 | Disposition: A | Discharge status: Home / Self Care | Payer: 59 | Source: Ambulatory Visit | Attending: Cardiology | Admitting: Cardiology

## 2020-10-16 LAB — ALDOSTERONE + RENIN ACTIVITY W/ RATIO
ALDO / PRA Ratio: 0 Ratio — ABNORMAL LOW (ref 0.9–28.9)
Aldosterone: 1 ng/dL
Renin Activity: 21.74 ng/mL/h — ABNORMAL HIGH (ref 0.25–5.82)

## 2020-10-16 LAB — MYOCARDIAL PERFUSION IMAGING
LV dias vol: 157 mL (ref 62–150)
LV sys vol: 82 mL
Peak HR: 86 {beats}/min
Rest HR: 68 {beats}/min
SDS: 2
SRS: 0
SSS: 2
TID: 0.98

## 2020-10-16 LAB — THYROID PANEL WITH TSH
Free Thyroxine Index: 1.7 (ref 1.4–3.8)
T3 Uptake: 33 % (ref 22–35)
T4, Total: 5.3 ug/dL (ref 4.9–10.5)
TSH: 0.96 mIU/L (ref 0.40–4.50)

## 2020-10-16 MED ORDER — TECHNETIUM TC 99M TETROFOSMIN IV KIT
30.8000 | PACK | Freq: Once | INTRAVENOUS | Status: AC | PRN
  Administered 2020-10-16: 30.8 via INTRAVENOUS

## 2020-10-27 ENCOUNTER — Other Ambulatory Visit: Payer: Self-pay | Admitting: Internal Medicine

## 2020-10-27 DIAGNOSIS — I10 Essential (primary) hypertension: Secondary | ICD-10-CM

## 2020-10-30 ENCOUNTER — Other Ambulatory Visit: Payer: Self-pay | Admitting: Internal Medicine

## 2020-10-30 DIAGNOSIS — I10 Essential (primary) hypertension: Secondary | ICD-10-CM

## 2020-11-04 ENCOUNTER — Other Ambulatory Visit: Payer: Self-pay | Admitting: Internal Medicine

## 2020-11-04 DIAGNOSIS — I10 Essential (primary) hypertension: Secondary | ICD-10-CM

## 2020-11-06 ENCOUNTER — Other Ambulatory Visit: Payer: Self-pay | Admitting: Internal Medicine

## 2020-11-06 ENCOUNTER — Other Ambulatory Visit: Payer: Self-pay

## 2020-11-06 ENCOUNTER — Encounter: Payer: Self-pay | Admitting: Internal Medicine

## 2020-11-06 ENCOUNTER — Ambulatory Visit (INDEPENDENT_AMBULATORY_CARE_PROVIDER_SITE_OTHER): Payer: 59 | Admitting: Internal Medicine

## 2020-11-06 VITALS — BP 118/60 | HR 71 | Temp 98.4°F | Ht 72.0 in | Wt 275.0 lb

## 2020-11-06 DIAGNOSIS — I10 Essential (primary) hypertension: Secondary | ICD-10-CM

## 2020-11-06 DIAGNOSIS — D539 Nutritional anemia, unspecified: Secondary | ICD-10-CM

## 2020-11-06 DIAGNOSIS — Z1159 Encounter for screening for other viral diseases: Secondary | ICD-10-CM | POA: Insufficient documentation

## 2020-11-06 LAB — CBC WITH DIFFERENTIAL/PLATELET
Basophils Absolute: 0 10*3/uL (ref 0.0–0.1)
Basophils Relative: 0.4 % (ref 0.0–3.0)
Eosinophils Absolute: 0.1 10*3/uL (ref 0.0–0.7)
Eosinophils Relative: 1.4 % (ref 0.0–5.0)
HCT: 32.6 % — ABNORMAL LOW (ref 39.0–52.0)
Hemoglobin: 11.5 g/dL — ABNORMAL LOW (ref 13.0–17.0)
Lymphocytes Relative: 20.2 % (ref 12.0–46.0)
Lymphs Abs: 1.3 10*3/uL (ref 0.7–4.0)
MCHC: 35.4 g/dL (ref 30.0–36.0)
MCV: 90.9 fl (ref 78.0–100.0)
Monocytes Absolute: 0.6 10*3/uL (ref 0.1–1.0)
Monocytes Relative: 8.7 % (ref 3.0–12.0)
Neutro Abs: 4.5 10*3/uL (ref 1.4–7.7)
Neutrophils Relative %: 69.3 % (ref 43.0–77.0)
Platelets: 173 10*3/uL (ref 150.0–400.0)
RBC: 3.59 Mil/uL — ABNORMAL LOW (ref 4.22–5.81)
RDW: 12.2 % (ref 11.5–15.5)
WBC: 6.5 10*3/uL (ref 4.0–10.5)

## 2020-11-06 LAB — IBC + FERRITIN
Ferritin: 573.6 ng/mL — ABNORMAL HIGH (ref 22.0–322.0)
Iron: 99 ug/dL (ref 42–165)
Saturation Ratios: 20 % (ref 20.0–50.0)
TIBC: 495.6 ug/dL — ABNORMAL HIGH (ref 250.0–450.0)
Transferrin: 354 mg/dL (ref 212.0–360.0)

## 2020-11-06 MED ORDER — EDARBI 80 MG PO TABS: 1.0000 | ORAL_TABLET | Freq: Every day | ORAL | 1 refills | Status: DC

## 2020-11-06 MED ORDER — CARVEDILOL 3.125 MG PO TABS: 3.1250 mg | ORAL_TABLET | Freq: Two times a day (BID) | ORAL | 1 refills | Status: DC

## 2020-11-06 MED ORDER — NEBIVOLOL HCL 5 MG PO TABS: 5.0000 mg | ORAL_TABLET | Freq: Every day | ORAL | 1 refills | Status: DC

## 2020-11-06 NOTE — Progress Notes (Signed)
Subjective:  Patient ID: Antonio Santos, male    DOB: 29-Jul-1975  Age: 45 y.o. MRN: [id number]  CC: Anemia and Hypertension  This visit occurred during the SARS-CoV-2 public health emergency.  Safety protocols were in place, including screening questions prior to the visit, additional usage of staff PPE, and extensive cleaning of exam room while observing appropriate contact time as indicated for disinfecting solutions.    HPI Antonio Santos presents for f/up -  Since I last saw him he has been working on his lifestyle modifications.  He walks 15,000 steps a day and has lost 15 pounds.  He denies headache, blurred vision, dizziness, lightheadedness, chest pain, shortness of breath, palpitations, or edema.  Outpatient Medications Prior to Visit  Medication Sig Dispense Refill   Ascorbic Acid (VITAMIN C) 500 MG CAPS Take 500 mg by mouth daily.     aspirin EC 81 MG EC tablet Take 1 tablet (81 mg total) by mouth daily. Swallow whole. 30 tablet 0   atorvastatin (LIPITOR) 40 MG tablet Take 1 tablet (40 mg total) by mouth daily. 30 tablet 0   Cholecalciferol (VITAMIN D-3) 125 MCG (5000 UT) TABS Take 1 tablet by mouth daily.     Cyanocobalamin (VITAMIN B-12) 5000 MCG TBDP Take 500 mcg by mouth daily.     fenofibrate micronized (LOFIBRA) 200 MG capsule Take 200 mg by mouth daily.      folic acid (FOLVITE) 1 MG tablet Take 1 tablet (1 mg total) by mouth daily.     EDARBI 80 MG TABS TAKE 1 TABLET BY MOUTH EVERY DAY 30 tablet 0   indapamide (LOZOL) 2.5 MG tablet TAKE 1 TABLET BY MOUTH EVERY DAY 30 tablet 0   nebivolol (BYSTOLIC) 5 MG tablet TAKE 1 TABLET (5 MG TOTAL) BY MOUTH DAILY. 30 tablet 0   oxyCODONE-acetaminophen (PERCOCET/ROXICET) 5-325 MG tablet Take 1 tablet by mouth 4 (four) times daily as needed.     No facility-administered medications prior to visit.    ROS Review of Systems  Constitutional:  Negative for chills, diaphoresis, fatigue and fever.  HENT: Negative.    Eyes:  Negative.   Respiratory:  Negative for cough, chest tightness, shortness of breath and wheezing.   Cardiovascular:  Negative for chest pain, palpitations and leg swelling.  Gastrointestinal:  Negative for abdominal pain, constipation, diarrhea, nausea and vomiting.  Endocrine: Negative.   Genitourinary: Negative.  Negative for difficulty urinating.  Musculoskeletal:  Negative for arthralgias, joint swelling and myalgias.  Skin: Negative.   Neurological:  Negative for dizziness, weakness and light-headedness.  Hematological:  Negative for adenopathy. Does not bruise/bleed easily.  Psychiatric/Behavioral: Negative.     Objective:  BP 118/60 (BP Location: Left Arm, Patient Position: Sitting, Cuff Size: Large)   Pulse 71   Temp 98.4 F (36.9 C) (Oral)   Ht 6' (1.829 m)   Wt 275 lb (124.7 kg)   SpO2 96%   BMI 37.30 kg/m   BP Readings from Last 3 Encounters:  11/06/20 118/60  10/07/20 (!) 166/78  05/28/20 (!) 143/83    Wt Readings from Last 3 Encounters:  11/06/20 275 lb (124.7 kg)  10/15/20 290 lb (131.5 kg)  10/07/20 290 lb (131.5 kg)    Physical Exam Vitals reviewed.  HENT:     Mouth/Throat:     Mouth: Mucous membranes are moist.  Eyes:     General: No scleral icterus.    Pupils: Pupils are equal, round, and reactive to light.  Cardiovascular:  Rate and Rhythm: Normal rate and regular rhythm.     Heart sounds: No murmur heard. Pulmonary:     Effort: Pulmonary effort is normal.     Breath sounds: No stridor. No wheezing, rhonchi or rales.  Abdominal:     General: Abdomen is flat.     Palpations: There is no mass.     Tenderness: There is no abdominal tenderness. There is no guarding.     Hernia: No hernia is present.  Musculoskeletal:        General: Normal range of motion.     Cervical back: Neck supple.     Right lower leg: No edema.     Left lower leg: No edema.  Lymphadenopathy:     Cervical: No cervical adenopathy.  Skin:    General: Skin is warm and  dry.  Neurological:     General: No focal deficit present.     Mental Status: He is alert.  Psychiatric:        Mood and Affect: Mood normal.        Behavior: Behavior normal.    Lab Results  Component Value Date   WBC 6.5 11/06/2020   HGB 11.5 (L) 11/06/2020   HCT 32.6 (L) 11/06/2020   PLT 173.0 11/06/2020   GLUCOSE 99 10/07/2020   CHOL 218 (H) 12/29/2019   TRIG 286 (H) 12/29/2019   HDL 47 12/29/2019   LDLCALC 114 (H) 12/29/2019   ALT 30 10/07/2020   AST 29 10/07/2020   NA 138 10/07/2020   K 3.9 10/07/2020   CL 101 10/07/2020   CREATININE 1.56 (H) 10/07/2020   BUN 21 10/07/2020   CO2 27 10/07/2020   TSH 0.96 10/07/2020   INR 1.0 12/29/2019   HGBA1C 5.3 12/29/2019    MYOCARDIAL PERFUSION IMAGING  Result Date: 10/16/2020  The left ventricular ejection fraction is mildly decreased (45-54%).  Nuclear stress EF: 47%. This may be an under estimation of true ejection fraction. Recent echocardiogram showed 65 to 70% EF.  There was no ST segment deviation noted during stress.  The study is normal.  This is a low risk study. No evidence of ischemia or infarction identified.  Donato Schultz, MD   Assessment & Plan:   Antonio Santos was seen today for anemia and hypertension.  Diagnoses and all orders for this visit:  Deficiency anemia- His H&H have declined slightly.  I will screen him for vitamin deficiencies. -     CBC with Differential/Platelet; Future -     IBC + Ferritin; Future -     Vitamin B1; Future -     Cancel: Hemoglobinopathy Evaluation; Future -     Vitamin B1 -     IBC + Ferritin -     CBC with Differential/Platelet  Need for hepatitis C screening test -     Hepatitis C antibody; Future -     Hepatitis C antibody  Primary hypertension- His blood pressure has become somewhat overcontrolled.  He has made great progress with his lifestyle modifications.  Nebivolol has to be changed to carvedilol.  Will continue the ARB.  I recommended that he stop taking  indapamide. -     Discontinue: nebivolol (BYSTOLIC) 5 MG tablet; 1 tablet (5 mg total) by mouth daily. -     Azilsartan Medoxomil (EDARBI) 80 MG TABS; Take 1 tablet (80 mg total) by mouth daily. -     carvedilol (COREG) 3.125 MG tablet; Take 1 tablet (3.125 mg total) by mouth 2 (two)  times daily with a meal.  I have discontinued Zale C. Colmenares's oxyCODONE-acetaminophen, indapamide, nebivolol, and nebivolol. I have also changed his [place]. Additionally, I am having him start on carvedilol. Lastly, I am having him maintain his fenofibrate micronized, aspirin, atorvastatin, Vitamin D-3, Vitamin C, Vitamin B-12, and folic acid.  Meds ordered this encounter  Medications   DISCONTD: nebivolol (BYSTOLIC) 5 MG tablet    Sig: Take 1 tablet (5 mg total) by mouth daily.    Dispense:  90 tablet    Refill:  1   Azilsartan Medoxomil (EDARBI) 80 MG TABS    Sig: Take 1 tablet (80 mg total) by mouth daily.    Dispense:  90 tablet    Refill:  1   carvedilol (COREG) 3.125 MG tablet    Sig: Take 1 tablet (3.125 mg total) by mouth 2 (two) times daily with a meal.    Dispense:  180 tablet    Refill:  1      Follow-up: Return in about 6 months (around 05/06/2021).  Sanda Linger, MD

## 2020-11-10 LAB — VITAMIN B1: Vitamin B1 (Thiamine): 15 nmol/L (ref 8–30)

## 2020-11-10 LAB — HEPATITIS C ANTIBODY
Hepatitis C Ab: NONREACTIVE
SIGNAL TO CUT-OFF: 0.01 (ref <1.00)

## 2020-11-13 ENCOUNTER — Encounter: Payer: Self-pay | Admitting: Internal Medicine

## 2020-11-14 ENCOUNTER — Encounter: Payer: Self-pay | Admitting: Internal Medicine

## 2020-11-27 ENCOUNTER — Other Ambulatory Visit: Payer: Self-pay | Admitting: Internal Medicine

## 2020-11-27 DIAGNOSIS — I10 Essential (primary) hypertension: Secondary | ICD-10-CM

## 2020-11-28 ENCOUNTER — Other Ambulatory Visit: Payer: Self-pay | Admitting: Internal Medicine

## 2020-11-28 ENCOUNTER — Encounter: Payer: Self-pay | Admitting: Internal Medicine

## 2020-11-28 DIAGNOSIS — I10 Essential (primary) hypertension: Secondary | ICD-10-CM

## 2020-11-28 MED ORDER — INDAPAMIDE 1.25 MG PO TABS: 1.2500 mg | ORAL_TABLET | Freq: Every day | ORAL | 0 refills | Status: DC

## 2020-12-02 ENCOUNTER — Encounter: Payer: Self-pay | Admitting: Adult Health

## 2020-12-03 ENCOUNTER — Ambulatory Visit (INDEPENDENT_AMBULATORY_CARE_PROVIDER_SITE_OTHER): Payer: 59 | Admitting: Adult Health

## 2020-12-03 ENCOUNTER — Encounter: Payer: Self-pay | Admitting: Adult Health

## 2020-12-03 ENCOUNTER — Encounter: Payer: Self-pay | Admitting: Internal Medicine

## 2020-12-03 ENCOUNTER — Other Ambulatory Visit: Payer: Self-pay | Admitting: Internal Medicine

## 2020-12-03 ENCOUNTER — Ambulatory Visit: Payer: 59 | Admitting: Adult Health

## 2020-12-03 VITALS — BP 141/86 | HR 87 | Ht 72.0 in | Wt 280.0 lb

## 2020-12-03 DIAGNOSIS — I6381 Other cerebral infarction due to occlusion or stenosis of small artery: Secondary | ICD-10-CM

## 2020-12-03 DIAGNOSIS — Z9989 Dependence on other enabling machines and devices: Secondary | ICD-10-CM | POA: Diagnosis not present

## 2020-12-03 DIAGNOSIS — I639 Cerebral infarction, unspecified: Secondary | ICD-10-CM

## 2020-12-03 DIAGNOSIS — G4733 Obstructive sleep apnea (adult) (pediatric): Secondary | ICD-10-CM | POA: Diagnosis not present

## 2020-12-03 NOTE — Progress Notes (Signed)
[facility] [address] [place]. [place] [address] [phone]       OFFICE FOLLOW UP NOTE  Mr. Dejean Tribby [facility] Date of Birth:  1975-09-08 Medical Record Number:  [id number]   Reason for visit: Stroke f/u and CPAP compliance visit    SUBJECTIVE:   CHIEF COMPLAINT:  Chief Complaint  Patient presents with   Follow-up    Rm 3 alone Pt is well and stable     HPI:    Today, 12/03/2020, Mr. Tsan returns for 6 months stroke and CPAP follow up unaccompanied. Overall doing well. No stroke symptoms. On aspirin and lipitor - no side effects. Blood pressure today 141/86. Routinely monitors at home - typically stable in AM and slightly high in PM. He has questions regarding current BP med use with recent adjustments by PCP for elevated BP.  Continues to do well on CPAP tolerating without difficulty.  Epworth Sleepiness Scale 3 (prior 6).  No further concerns at this time.          History provided for reference purposes only Update 05/08/2020 JM: Mr. Boodram returns for scheduled 56-month stroke follow-up as well as initial CPAP compliance visit.  Doing well since prior visit reporting complete recovery without residual stroke deficits. Denies new stroke/TIA symptoms  Compliant on aspirin and atorvastatin -denies related side effects Reports fasting lab work in 04/2020 with PCP - all satisfactory  Blood pressure today 143/83 - monitors and typically 130s/70s  Remains on B12 and folic acid 01/2020 B12 738 and homocysteine 26.5  Followed by Dr. Vickey Huger for sleep apnea with sleep study 12/21 showing severe sleep apnea with total AHI 93.2/h and prolonged hypoxemia.  Titration study showed successful treatment of both apnea and hypoxia under CPAP of 12.  AutoPap initiated and was started on 2/8.  Review of compliance report shows 77% compliance with residual AHI 0.3. he has used 30/30 days but typically sleeps only 3-7 hrs per night.  He has been tolerating  CPAP well without difficulty.  He has had difficulty obtaining supplies through [employer].  Reports great improvement of daytime fatigue and energy levels.  ESS: 6 (prior 6) FSS: 23 (prior 19)  Initial visit 01/29/2020 JM: Mr. Snedden is being seen for hospital follow-up accompanied by his wife.  He has been doing well since discharge with very slight left arm weakness and occasional short lasting left arm numbness. 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 Denies new or worsening stroke/TIA symptoms.  Hypercoagulable labs largely unremarkable except slightly elevated protein C and elevated homocysteine level during admission and has been taking B12 supplement.  Wife questions ongoing need or correct dosage amount of current B12 supplement. Remains on aspirin 81mg  daily without bleeding or bruising. Remains on atorvastatin 40mg  daily without myalgias. Blood pressure 128/76 with PCP recently initiating lisinopril-hydrochlorothiazide.  He was evaluated by Dr. for possible underlying sleep apnea on 11/18 and currently awaiting to undergo sleep study.  No further concerns at this time.  Stroke admission 12/28/2019 Mr. Aadarsh Cozort is a 45 y.o. male with history of obesity, back pain, and hypercholesterolemia who presented on 12/28/2019 at [facility] for visual disturbance, concerning for posterior circulation stroke with reported negative MRI and discharge to follow-up with ophthalmology. At the opthamologist's office he developed R gaze, flacced LUE, LLE weakness and L facial droop. He then presented via EMS to [facility] ED for evaluation.  Personally reviewed hospitalization pertinent progress notes, lab work and imaging with summary provided.  Evaluated by Dr. 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  Recommended DAPT for 3 weeks and aspirin alone.  LDL 114 and initiate atorvastatin 40 mg daily.  No history of DM or HTN.  Other stroke risk factors include suspected OSA and obesity but no prior stroke history.  Other active problems include AKI and chronic back pain.  Presenting symptoms resolved evaluated by therapies and was discharged home in stable condition without therapy needs.  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 Possible inferior medial R thalamic hypodensity. ASPECTS 10.    CTA head & neck no LVO CT perfusion no core infarct or penumbra MRI  Medial R thalamic infarct. Contralateral punctate medial L thalamic infarct. 2D Echo EF 65 to 70% LDL 114 HgbA1c 5.3 VTE prophylaxis - SCDs  No antithrombotic prior to admission, now on aspirin 81 mg daily and clopidogrel 75 mg daily. Continue DAPT x 3 weeks then aspirin alone Therapy recommendations:   None Disposition:   Home       ROS:   14 system review of systems performed and negative with exception of those listed in HPI  PMH: History reviewed. No pertinent past medical history.  PSH: History reviewed. No pertinent surgical history.  Social History:  Social History   Socioeconomic History   Marital status: Married    Spouse name: Not on file   Number of children: Not on file   Years of education: Not on file   Highest education level: Not on file  Occupational History   Not on file  Tobacco Use   Smoking status: Never   Smokeless tobacco: Never  Substance and Sexual Activity   Alcohol use: Yes    Alcohol/week: 3.0 standard drinks    Types: 3 Cans of beer per week   Drug use: Never   Sexual activity: Yes    Partners: Female  Other Topics Concern   Not on file  Social History Narrative   Not on file   Social Determinants of Health   Financial  Resource Strain: Not on file  Food Insecurity: Not on file  Transportation Needs: Not on file  Physical Activity: Not on file  Stress: Not on file  Social Connections: Not on file  Intimate Partner Violence: Not on file    Family History:  Family History  Problem Relation Age of Onset   Hypertension Mother    Hypertension Father     Medications:   Current Outpatient Medications on File Prior to Visit  Medication Sig Dispense Refill   Ascorbic Acid (VITAMIN C) 500 MG CAPS Take 500 mg by mouth daily.     aspirin EC 81 MG EC tablet Take 1 tablet (81 mg total) by mouth daily. Swallow whole. 30 tablet 0   atorvastatin (LIPITOR) 40 MG tablet Take 1 tablet (40 mg total) by mouth daily. 30 tablet 0   carvedilol (COREG) 3.125 MG tablet Take 1 tablet (3.125 mg total) by mouth 2 (two) times daily with a meal. 180 tablet 1   Cholecalciferol (VITAMIN D-3) 125 MCG (5000 UT) TABS Take 1 tablet by mouth daily.     Cyanocobalamin (VITAMIN B-12) 5000 MCG TBDP Take 500 mcg  by mouth daily.     fenofibrate micronized (LOFIBRA) 200 MG capsule Take 200 mg by mouth daily.      folic acid (FOLVITE) 1 MG tablet Take 1 tablet (1 mg total) by mouth daily.     indapamide (LOZOL) 1.25 MG tablet Take 1 tablet (1.25 mg total) by mouth daily. 90 tablet 0   Azilsartan Medoxomil (EDARBI) 80 MG TABS Take 1 tablet (80 mg total) by mouth daily. 90 tablet 1   No current facility-administered medications on file prior to visit.    Allergies:   Allergies  Allergen Reactions   Penicillins Other (See Comments)    Occurred when he was a child      OBJECTIVE:  Physical Exam  Vitals:   12/03/20 1421  BP: (!) 141/86  Pulse: 87  Weight: 280 lb (127 kg)  Height: 6' (1.829 m)   Body mass index is 37.97 kg/m. No results found.  General: well developed, well nourished,  pleasant middle-age Caucasian male, seated, in no evident distress Head: head normocephalic and atraumatic.   Neck: supple with no carotid or  supraclavicular bruits Cardiovascular: regular rate and rhythm, no murmurs Musculoskeletal: no deformity Skin:  no rash/petichiae Vascular:  Normal pulses all extremities   Neurologic Exam Mental Status: Awake and fully alert.   Fluent speech and language.  Oriented to place and time. Recent and remote memory intact. Attention span, concentration and fund of knowledge appropriate. Mood and affect appropriate.  Cranial Nerves: Pupils equal, briskly reactive to light. Extraocular movements full without nystagmus. Visual fields full to confrontation. Hearing intact. Facial sensation intact. Face, tongue, palate moves normally and symmetrically.  Motor: Normal bulk and tone. Normal strength in all tested extremity muscles. Sensory.: intact to touch , pinprick , position and vibratory sensation.  Coordination: Rapid alternating movements normal in all extremities. Finger-to-nose and heel-to-shin performed accurately bilaterally. Gait and Station: Arises from chair without difficulty. Stance is normal. Gait demonstrates normal stride length and balance without use of assistive device. Reflexes: 1+ and symmetric. Toes downgoing.       ASSESSMENT: Cliff Damiani is a 45 y.o. year old male presented initially to [facility] for visual disturbance on 12/28/2019 with MRI negative and was discharged to follow-up with ophthalmology.  That same day during ophthalmology office visit, he developed R gaze, LUE flaccid, LLE weakness and L facial droop.  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 Vascular risk factors include HLD and OSA on CPAP.      PLAN:  B/l small punctate medial thalamic infarcts:  Recovered well without residual deficit Continue aspirin 81 mg daily  and atorvastatin for secondary stroke prevention.  Discussed secondary stroke prevention measures and  importance of close PCP follow up for aggressive stroke risk factor management  HTN: BP goal <130/90.  Verified BP med regimen with Dr. Yetta Barre via Epic secure chat which was further discussed with patient - continue azilsartan, carvedilol and indapamide - patient verbalized understanding.  Continue to monitor at home HLD: LDL goal <70.  On atorvastatin 40 mg daily -recent lipid panel obtained by PCP - he will bring labs to office for further review  Severe OSA:  Excellent compliance with optimal residual AHI 0.1.  Continue current settings and routine follow-up with DME company for any needed supplies or CPAP related concerns Initial AHI 93.2 with prolonged hypoxemia per split-night study 02/2020.  Encouraged continued use of nightly usage and to follow with DME company for any supplies or CPAP related concerns.    B12 and folate deficiency:  Continue B12 supplement and folic acid.  Prior levels satisfactory    Follow up in 1 year for CPAP and continue to follow with PCP for continued aggressive stroke risk factor management and monitoring.  Advised to call sooner if needed  CC:  [facility] provider: Dr. Mechele Collin, Bernadene Bell, MD    I spent 38 minutes of face-to-face and non-face-to-face time with patient.  This included previsit chart review, lab review, study review, order entry, electronic health record documentation, patient education and discussion regarding prior stroke and etiology, B12 and folate deficiency, OSA on CPAP with reviewing and discussion of compliance report, importance of managing stroke risk factors and answered all other questions to patient satisfaction  Ihor Austin, AGNP-BC  [facility] [address] [place], [place] [address]  Phone [phone] Fax [phone] Note: This document was prepared with digital dictation and possible smart phrase technology. Any transcriptional errors that result from this process are  unintentional.

## 2020-12-03 NOTE — Patient Instructions (Signed)
No changes today. Continue current treatment plan   Follow up in 1 year or call earlier if needed

## 2021-01-21 ENCOUNTER — Encounter: Payer: Self-pay | Admitting: Internal Medicine

## 2021-01-22 ENCOUNTER — Other Ambulatory Visit: Payer: Self-pay | Admitting: Internal Medicine

## 2021-01-22 DIAGNOSIS — E785 Hyperlipidemia, unspecified: Secondary | ICD-10-CM

## 2021-01-22 MED ORDER — ATORVASTATIN CALCIUM 40 MG PO TABS: 40.0000 mg | ORAL_TABLET | Freq: Every day | ORAL | 0 refills | Status: DC

## 2021-01-28 ENCOUNTER — Other Ambulatory Visit: Payer: Self-pay | Admitting: Internal Medicine

## 2021-01-28 DIAGNOSIS — E785 Hyperlipidemia, unspecified: Secondary | ICD-10-CM

## 2021-01-28 DIAGNOSIS — I10 Essential (primary) hypertension: Secondary | ICD-10-CM

## 2021-01-28 MED ORDER — ATORVASTATIN CALCIUM 40 MG PO TABS: 40.0000 mg | ORAL_TABLET | Freq: Every day | ORAL | 0 refills | Status: DC

## 2021-01-28 MED ORDER — INDAPAMIDE 1.25 MG PO TABS: 1.2500 mg | ORAL_TABLET | Freq: Every day | ORAL | 0 refills | Status: DC

## 2021-03-24 ENCOUNTER — Other Ambulatory Visit: Payer: Self-pay | Admitting: Internal Medicine

## 2021-03-24 DIAGNOSIS — I10 Essential (primary) hypertension: Secondary | ICD-10-CM

## 2021-04-09 ENCOUNTER — Encounter: Payer: Self-pay | Admitting: Internal Medicine

## 2021-04-10 ENCOUNTER — Other Ambulatory Visit: Payer: Self-pay | Admitting: Internal Medicine

## 2021-04-10 DIAGNOSIS — I10 Essential (primary) hypertension: Secondary | ICD-10-CM

## 2021-04-10 MED ORDER — OLMESARTAN MEDOXOMIL 40 MG PO TABS: 40.0000 mg | ORAL_TABLET | Freq: Every day | ORAL | 0 refills | Status: DC

## 2021-05-06 ENCOUNTER — Encounter: Payer: Self-pay | Admitting: Internal Medicine

## 2021-05-06 ENCOUNTER — Ambulatory Visit (INDEPENDENT_AMBULATORY_CARE_PROVIDER_SITE_OTHER): Payer: BC Managed Care – PPO | Admitting: Internal Medicine

## 2021-05-06 ENCOUNTER — Other Ambulatory Visit: Payer: Self-pay

## 2021-05-06 VITALS — BP 134/70 | HR 81 | Temp 98.5°F | Resp 16 | Ht 72.0 in | Wt 289.0 lb

## 2021-05-06 DIAGNOSIS — Z23 Encounter for immunization: Secondary | ICD-10-CM | POA: Diagnosis not present

## 2021-05-06 DIAGNOSIS — E781 Pure hyperglyceridemia: Secondary | ICD-10-CM | POA: Diagnosis not present

## 2021-05-06 DIAGNOSIS — Z1211 Encounter for screening for malignant neoplasm of colon: Secondary | ICD-10-CM

## 2021-05-06 DIAGNOSIS — E785 Hyperlipidemia, unspecified: Secondary | ICD-10-CM | POA: Diagnosis not present

## 2021-05-06 DIAGNOSIS — I10 Essential (primary) hypertension: Secondary | ICD-10-CM | POA: Diagnosis not present

## 2021-05-06 DIAGNOSIS — I63331 Cerebral infarction due to thrombosis of right posterior cerebral artery: Secondary | ICD-10-CM

## 2021-05-06 DIAGNOSIS — Z0001 Encounter for general adult medical examination with abnormal findings: Secondary | ICD-10-CM

## 2021-05-06 LAB — PSA: PSA: 0.36 ng/mL (ref 0.10–4.00)

## 2021-05-06 LAB — BASIC METABOLIC PANEL
BUN: 27 mg/dL — ABNORMAL HIGH (ref 6–23)
CO2: 28 mEq/L (ref 19–32)
Calcium: 10 mg/dL (ref 8.4–10.5)
Chloride: 101 mEq/L (ref 96–112)
Creatinine, Ser: 1.89 mg/dL — ABNORMAL HIGH (ref 0.40–1.50)
GFR: 41.86 mL/min — ABNORMAL LOW (ref >60.00)
Glucose, Bld: 94 mg/dL (ref 70–99)
Potassium: 4 mEq/L (ref 3.5–5.1)
Sodium: 138 mEq/L (ref 135–145)

## 2021-05-06 LAB — LIPID PANEL
Cholesterol: 149 mg/dL (ref 0–200)
HDL: 37.3 mg/dL — ABNORMAL LOW (ref >39.00)
NonHDL: 111.89
Total CHOL/HDL Ratio: 4
Triglycerides: 374 mg/dL — ABNORMAL HIGH (ref 0.0–149.0)
VLDL: 74.8 mg/dL — ABNORMAL HIGH (ref 0.0–40.0)

## 2021-05-06 LAB — CBC WITH DIFFERENTIAL/PLATELET
Basophils Absolute: 0.1 10*3/uL (ref 0.0–0.1)
Basophils Relative: 0.9 % (ref 0.0–3.0)
Eosinophils Absolute: 0.1 10*3/uL (ref 0.0–0.7)
Eosinophils Relative: 1.7 % (ref 0.0–5.0)
HCT: 35.7 % — ABNORMAL LOW (ref 39.0–52.0)
Hemoglobin: 12.3 g/dL — ABNORMAL LOW (ref 13.0–17.0)
Lymphocytes Relative: 27.9 % (ref 12.0–46.0)
Lymphs Abs: 1.7 10*3/uL (ref 0.7–4.0)
MCHC: 34.5 g/dL (ref 30.0–36.0)
MCV: 90.7 fl (ref 78.0–100.0)
Monocytes Absolute: 0.6 10*3/uL (ref 0.1–1.0)
Monocytes Relative: 9.6 % (ref 3.0–12.0)
Neutro Abs: 3.7 10*3/uL (ref 1.4–7.7)
Neutrophils Relative %: 59.9 % (ref 43.0–77.0)
Platelets: 237 10*3/uL (ref 150.0–400.0)
RBC: 3.94 Mil/uL — ABNORMAL LOW (ref 4.22–5.81)
RDW: 12.3 % (ref 11.5–15.5)
WBC: 6.2 10*3/uL (ref 4.0–10.5)

## 2021-05-06 LAB — LDL CHOLESTEROL, DIRECT: Direct LDL: 70 mg/dL

## 2021-05-06 NOTE — Patient Instructions (Signed)

## 2021-05-06 NOTE — Progress Notes (Signed)
Subjective:  Patient ID: Antonio Santos, male    DOB: 12-27-1975  Age: 46 y.o. MRN: [id number]  CC: Annual Exam, Hypertension, Anemia, and Hyperlipidemia  This visit occurred during the SARS-CoV-2 public health emergency.  Safety protocols were in place, including screening questions prior to the visit, additional usage of staff PPE, and extensive cleaning of exam room while observing appropriate contact time as indicated for disinfecting solutions.    HPI Antonio Santos presents for a CPX and f/up -   He tells me his blood pressure is well controlled.  He is active and denies chest pain, shortness of breath, dyspnea on exertion, diaphoresis, edema, or fatigue.  He has no residual symptoms from the CVA.  Outpatient Medications Prior to Visit  Medication Sig Dispense Refill   Ascorbic Acid (VITAMIN C) 500 MG CAPS Take 500 mg by mouth daily.     Cholecalciferol (VITAMIN D-3) 125 MCG (5000 UT) TABS Take 1 tablet by mouth daily.     Cyanocobalamin (VITAMIN B-12) 5000 MCG TBDP Take 500 mcg by mouth daily.     folic acid (FOLVITE) 1 MG tablet Take 1 tablet (1 mg total) by mouth daily.     olmesartan (BENICAR) 40 MG tablet Take 1 tablet (40 mg total) by mouth daily. 90 tablet 0   aspirin EC 81 MG EC tablet Take 1 tablet (81 mg total) by mouth daily. Swallow whole. 30 tablet 0   atorvastatin (LIPITOR) 40 MG tablet Take 1 tablet (40 mg total) by mouth daily. 90 tablet 0   carvedilol (COREG) 3.125 MG tablet Take 1 tablet (3.125 mg total) by mouth 2 (two) times daily with a meal. 180 tablet 1   fenofibrate micronized (LOFIBRA) 200 MG capsule Take 200 mg by mouth daily.      indapamide (LOZOL) 1.25 MG tablet Take 1 tablet (1.25 mg total) by mouth daily. 90 tablet 0   No facility-administered medications prior to visit.    ROS Review of Systems  Constitutional:  Positive for unexpected weight change (wt gain). Negative for chills, diaphoresis and fatigue.  HENT: Negative.    Eyes:   Negative for visual disturbance.  Respiratory:  Negative for cough, chest tightness, shortness of breath and wheezing.   Cardiovascular:  Negative for chest pain, palpitations and leg swelling.  Gastrointestinal:  Negative for abdominal pain, constipation, diarrhea, nausea and vomiting.  Endocrine: Negative.   Genitourinary: Negative.  Negative for difficulty urinating.  Musculoskeletal:  Negative for arthralgias, joint swelling and myalgias.  Skin: Negative.  Negative for color change and pallor.  Neurological:  Negative for dizziness, weakness, light-headedness and headaches.  Hematological:  Negative for adenopathy. Does not bruise/bleed easily.  Psychiatric/Behavioral: Negative.     Objective:  BP 134/70 (BP Location: Left Arm, Patient Position: Sitting, Cuff Size: Large)    Pulse 81    Temp 98.5 F (36.9 C) (Oral)    Resp 16    Ht 6' (1.829 m)    Wt 289 lb (131.1 kg)    SpO2 96%    BMI 39.20 kg/m   BP Readings from Last 3 Encounters:  05/06/21 134/70  12/03/20 (!) 141/86  11/06/20 118/60    Wt Readings from Last 3 Encounters:  05/06/21 289 lb (131.1 kg)  12/03/20 280 lb (127 kg)  11/06/20 275 lb (124.7 kg)    Physical Exam Vitals reviewed.  Constitutional:      Appearance: Normal appearance.  HENT:     Nose: Nose normal.  Mouth/Throat:     Mouth: Mucous membranes are moist.  Eyes:     General: No scleral icterus.    Conjunctiva/sclera: Conjunctivae normal.  Cardiovascular:     Rate and Rhythm: Normal rate and regular rhythm.     Heart sounds: No murmur heard. Pulmonary:     Effort: Pulmonary effort is normal.     Breath sounds: No stridor. No wheezing, rhonchi or rales.  Abdominal:     General: Abdomen is protuberant.     Palpations: There is no hepatomegaly, splenomegaly or mass.     Tenderness: There is no abdominal tenderness. There is no guarding.     Hernia: No hernia is present.  Musculoskeletal:        General: Normal range of motion.     Cervical  back: Neck supple.     Right lower leg: No edema.     Left lower leg: No edema.  Lymphadenopathy:     Cervical: No cervical adenopathy.  Skin:    General: Skin is warm and dry.     Coloration: Skin is not pale.  Neurological:     General: No focal deficit present.     Mental Status: He is alert. Mental status is at baseline.  Psychiatric:        Mood and Affect: Mood normal.        Behavior: Behavior normal.    Lab Results  Component Value Date   WBC 6.2 05/06/2021   HGB 12.3 (L) 05/06/2021   HCT 35.7 (L) 05/06/2021   PLT 237.0 05/06/2021   GLUCOSE 94 05/06/2021   CHOL 149 05/06/2021   TRIG 374.0 (H) 05/06/2021   HDL 37.30 (L) 05/06/2021   LDLDIRECT 70.0 05/06/2021   LDLCALC 114 (H) 12/29/2019   ALT 30 10/07/2020   AST 29 10/07/2020   NA 138 05/06/2021   K 4.0 05/06/2021   CL 101 05/06/2021   CREATININE 1.89 (H) 05/06/2021   BUN 27 (H) 05/06/2021   CO2 28 05/06/2021   TSH 0.96 10/07/2020   PSA 0.36 05/06/2021   INR 1.0 12/29/2019   HGBA1C 5.3 12/29/2019    MYOCARDIAL PERFUSION IMAGING  Result Date: 10/16/2020  The left ventricular ejection fraction is mildly decreased (45-54%).  Nuclear stress EF: 47%. This may be an under estimation of true ejection fraction. Recent echocardiogram showed 65 to 70% EF.  There was no ST segment deviation noted during stress.  The study is normal.  This is a low risk study. No evidence of ischemia or infarction identified.  Antonio Furbish, MD   Assessment & Plan:   Antonio Santos was seen today for annual exam, hypertension, anemia and hyperlipidemia.  Diagnoses and all orders for this visit:  Primary hypertension- His BP is well controlled -     CBC with Differential/Platelet; Future -     Basic metabolic panel; Future -     Basic metabolic panel -     CBC with Differential/Platelet -     indapamide (LOZOL) 1.25 MG tablet; Take 1 tablet (1.25 mg total) by mouth daily. -     carvedilol (COREG) 3.125 MG tablet; Take 1 tablet (3.125 mg  total) by mouth 2 (two) times daily with a meal.  Encounter for general adult medical examination with abnormal findings- Exam completed, labs reviewed, Vaccines reviewed; he refused a flu vax, cancer screenings addressed, pt ed material was given. -     Lipid panel; Future -     PSA; Future -  PSA -     Lipid panel  Colon cancer screening -     Ambulatory referral to Gastroenterology  Dyslipidemia, goal LDL below 130- LDL goal achieved. Doing well on the statin  -     atorvastatin (LIPITOR) 40 MG tablet; Take 1 tablet (40 mg total) by mouth daily.  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 -     aspirin 81 MG EC tablet; Take 1 tablet (81 mg total) by mouth daily. Swallow whole. -     Discontinue: icosapent Ethyl (VASCEPA) 1 g capsule; Take 2 capsules (2 g total) by mouth 2 (two) times daily. -     icosapent Ethyl (VASCEPA) 1 g capsule; Take 2 capsules (2 g total) by mouth 2 (two) times daily.  Primary hypertriglyceridemia -     Discontinue: icosapent Ethyl (VASCEPA) 1 g capsule; Take 2 capsules (2 g total) by mouth 2 (two) times daily. -     icosapent Ethyl (VASCEPA) 1 g capsule; Take 2 capsules (2 g total) by mouth 2 (two) times daily.  Other orders -     Tdap vaccine greater than or equal to 7yo IM -     LDL cholesterol, direct   I have discontinued Zoran C. Mccallister's fenofibrate micronized. I have also changed his aspirin. Additionally, I am having him maintain his Vitamin D-3, Vitamin C, Vitamin 0000000, folic acid, olmesartan, atorvastatin, indapamide, carvedilol, and icosapent Ethyl.  Meds ordered this encounter  Medications   atorvastatin (LIPITOR) 40 MG tablet    Sig: Take 1 tablet (40 mg total) by mouth daily.    Dispense:  90 tablet    Refill:  1   indapamide (LOZOL) 1.25 MG tablet    Sig: Take 1 tablet (1.25 mg total) by mouth daily.    Dispense:  90 tablet    Refill:  1   aspirin 81 MG EC  tablet    Sig: Take 1 tablet (81 mg total) by mouth daily. Swallow whole.    Dispense:  90 tablet    Refill:  1   carvedilol (COREG) 3.125 MG tablet    Sig: Take 1 tablet (3.125 mg total) by mouth 2 (two) times daily with a meal.    Dispense:  180 tablet    Refill:  1   DISCONTD: icosapent Ethyl (VASCEPA) 1 g capsule    Sig: Take 2 capsules (2 g total) by mouth 2 (two) times daily.    Dispense:  360 capsule    Refill:  1   icosapent Ethyl (VASCEPA) 1 g capsule    Sig: Take 2 capsules (2 g total) by mouth 2 (two) times daily.    Dispense:  360 capsule    Refill:  1     Follow-up: Return in about 6 months (around 11/03/2021).  Scarlette Calico, MD

## 2021-05-07 MED ORDER — ASPIRIN 81 MG PO TBEC: 81.0000 mg | DELAYED_RELEASE_TABLET | Freq: Every day | ORAL | 1 refills | Status: DC

## 2021-05-07 MED ORDER — ATORVASTATIN CALCIUM 40 MG PO TABS: 40.0000 mg | ORAL_TABLET | Freq: Every day | ORAL | 1 refills | Status: DC

## 2021-05-07 MED ORDER — CARVEDILOL 3.125 MG PO TABS: 3.1250 mg | ORAL_TABLET | Freq: Two times a day (BID) | ORAL | 1 refills | Status: DC

## 2021-05-07 MED ORDER — INDAPAMIDE 1.25 MG PO TABS: 1.2500 mg | ORAL_TABLET | Freq: Every day | ORAL | 1 refills | Status: DC

## 2021-05-07 MED ORDER — ICOSAPENT ETHYL 1 G PO CAPS: 2.0000 g | ORAL_CAPSULE | Freq: Two times a day (BID) | ORAL | 1 refills | Status: DC

## 2021-07-06 ENCOUNTER — Other Ambulatory Visit: Payer: Self-pay | Admitting: Internal Medicine

## 2021-07-06 DIAGNOSIS — I10 Essential (primary) hypertension: Secondary | ICD-10-CM

## 2021-10-01 ENCOUNTER — Other Ambulatory Visit: Payer: Self-pay | Admitting: Internal Medicine

## 2021-10-01 DIAGNOSIS — I10 Essential (primary) hypertension: Secondary | ICD-10-CM

## 2021-10-18 ENCOUNTER — Other Ambulatory Visit: Payer: Self-pay | Admitting: Internal Medicine

## 2021-10-18 DIAGNOSIS — I10 Essential (primary) hypertension: Secondary | ICD-10-CM

## 2021-10-18 DIAGNOSIS — I63331 Cerebral infarction due to thrombosis of right posterior cerebral artery: Secondary | ICD-10-CM

## 2021-11-10 ENCOUNTER — Other Ambulatory Visit: Payer: Self-pay | Admitting: Internal Medicine

## 2021-11-10 DIAGNOSIS — I10 Essential (primary) hypertension: Secondary | ICD-10-CM

## 2021-11-17 ENCOUNTER — Encounter: Payer: Self-pay | Admitting: Internal Medicine

## 2021-11-18 ENCOUNTER — Ambulatory Visit (INDEPENDENT_AMBULATORY_CARE_PROVIDER_SITE_OTHER): Payer: BC Managed Care – PPO | Admitting: Internal Medicine

## 2021-11-18 ENCOUNTER — Encounter: Payer: Self-pay | Admitting: Internal Medicine

## 2021-11-18 VITALS — BP 136/72 | HR 88 | Temp 98.0°F | Ht 72.0 in | Wt 298.0 lb

## 2021-11-18 DIAGNOSIS — D539 Nutritional anemia, unspecified: Secondary | ICD-10-CM

## 2021-11-18 DIAGNOSIS — E781 Pure hyperglyceridemia: Secondary | ICD-10-CM | POA: Diagnosis not present

## 2021-11-18 DIAGNOSIS — I63331 Cerebral infarction due to thrombosis of right posterior cerebral artery: Secondary | ICD-10-CM | POA: Diagnosis not present

## 2021-11-18 DIAGNOSIS — N182 Chronic kidney disease, stage 2 (mild): Secondary | ICD-10-CM | POA: Insufficient documentation

## 2021-11-18 DIAGNOSIS — E785 Hyperlipidemia, unspecified: Secondary | ICD-10-CM | POA: Insufficient documentation

## 2021-11-18 DIAGNOSIS — I10 Essential (primary) hypertension: Secondary | ICD-10-CM

## 2021-11-18 LAB — BASIC METABOLIC PANEL
BUN: 30 mg/dL — ABNORMAL HIGH (ref 6–23)
CO2: 24 mEq/L (ref 19–32)
Calcium: 9.8 mg/dL (ref 8.4–10.5)
Chloride: 104 mEq/L (ref 96–112)
Creatinine, Ser: 1.76 mg/dL — ABNORMAL HIGH (ref 0.40–1.50)
GFR: 45.43 mL/min — ABNORMAL LOW (ref >60.00)
Glucose, Bld: 115 mg/dL — ABNORMAL HIGH (ref 70–99)
Potassium: 3.8 mEq/L (ref 3.5–5.1)
Sodium: 138 mEq/L (ref 135–145)

## 2021-11-18 LAB — CBC WITH DIFFERENTIAL/PLATELET
Basophils Absolute: 0 10*3/uL (ref 0.0–0.1)
Basophils Relative: 0.6 % (ref 0.0–3.0)
Eosinophils Absolute: 0.1 10*3/uL (ref 0.0–0.7)
Eosinophils Relative: 2.3 % (ref 0.0–5.0)
HCT: 33.1 % — ABNORMAL LOW (ref 39.0–52.0)
Hemoglobin: 11.5 g/dL — ABNORMAL LOW (ref 13.0–17.0)
Lymphocytes Relative: 28.3 % (ref 12.0–46.0)
Lymphs Abs: 1.7 10*3/uL (ref 0.7–4.0)
MCHC: 34.7 g/dL (ref 30.0–36.0)
MCV: 91.5 fl (ref 78.0–100.0)
Monocytes Absolute: 0.5 10*3/uL (ref 0.1–1.0)
Monocytes Relative: 7.9 % (ref 3.0–12.0)
Neutro Abs: 3.6 10*3/uL (ref 1.4–7.7)
Neutrophils Relative %: 60.9 % (ref 43.0–77.0)
Platelets: 215 10*3/uL (ref 150.0–400.0)
RBC: 3.62 Mil/uL — ABNORMAL LOW (ref 4.22–5.81)
RDW: 12.7 % (ref 11.5–15.5)
WBC: 6 10*3/uL (ref 4.0–10.5)

## 2021-11-18 LAB — TRIGLYCERIDES: Triglycerides: 262 mg/dL — ABNORMAL HIGH (ref 0.0–149.0)

## 2021-11-18 MED ORDER — ICOSAPENT ETHYL 1 G PO CAPS: 2.0000 g | ORAL_CAPSULE | Freq: Two times a day (BID) | ORAL | 1 refills | Status: DC

## 2021-11-18 MED ORDER — CARVEDILOL 3.125 MG PO TABS: 3.1250 mg | ORAL_TABLET | Freq: Two times a day (BID) | ORAL | 1 refills | Status: DC

## 2021-11-18 NOTE — Progress Notes (Signed)
Subjective:  Patient ID: Antonio Santos, male    DOB: [birth year]  Age: 46 y.o. MRN: [id number]  CC: Anemia and Hypertension   HPI Antonio Santos presents for f/up -  He feels well today and offers no complaints.  He is active and denies chest pain, shortness of breath, diaphoresis, or edema.  Outpatient Medications Prior to Visit  Medication Sig Dispense Refill   Ascorbic Acid (VITAMIN C) 500 MG CAPS Take 500 mg by mouth daily.     aspirin 81 MG EC tablet Take 1 tablet (81 mg total) by mouth daily. Swallow whole. 90 tablet 1   atorvastatin (LIPITOR) 40 MG tablet Take 1 tablet (40 mg total) by mouth daily. 90 tablet 1   Cholecalciferol (VITAMIN D-3) 125 MCG (5000 UT) TABS Take 1 tablet by mouth daily.     Cyanocobalamin (VITAMIN B-12) 5000 MCG TBDP Take 500 mcg by mouth daily.     folic acid (FOLVITE) 1 MG tablet Take 1 tablet (1 mg total) by mouth daily.     indapamide (LOZOL) 1.25 MG tablet Take 1 tablet (1.25 mg total) by mouth daily. 90 tablet 1   olmesartan (BENICAR) 40 MG tablet TAKE 1 TABLET BY MOUTH EVERY DAY 90 tablet 0   carvedilol (COREG) 3.125 MG tablet Take 1 tablet (3.125 mg total) by mouth 2 (two) times daily with a meal. 180 tablet 1   icosapent Ethyl (VASCEPA) 1 g capsule Take 2 capsules (2 g total) by mouth 2 (two) times daily. 360 capsule 1   No facility-administered medications prior to visit.    ROS Review of Systems  Constitutional:  Negative for chills, diaphoresis, fatigue and fever.  HENT: Negative.    Eyes: Negative.   Respiratory:  Negative for cough, chest tightness, shortness of breath and wheezing.   Cardiovascular:  Negative for chest pain, palpitations and leg swelling.  Gastrointestinal: Negative.  Negative for abdominal pain, diarrhea and nausea.  Endocrine: Negative.   Genitourinary: Negative.  Negative for difficulty urinating.  Musculoskeletal: Negative.  Negative for myalgias.  Skin: Negative.   Neurological:  Negative for  dizziness, weakness and light-headedness.  Hematological:  Negative for adenopathy. Does not bruise/bleed easily.  Psychiatric/Behavioral: Negative.      Objective:  BP 136/72   Pulse 88   Temp 98 F (36.7 C) (Oral)   Ht 6' (1.829 m)   Wt 298 lb (135.2 kg)   SpO2 95%   BMI 40.42 kg/m   BP Readings from Last 3 Encounters:  11/18/21 136/72  05/06/21 134/70  12/03/20 (!) 141/86    Wt Readings from Last 3 Encounters:  11/18/21 298 lb (135.2 kg)  05/06/21 289 lb (131.1 kg)  12/03/20 280 lb (127 kg)    Physical Exam Vitals reviewed.  HENT:     Nose: Nose normal.     Mouth/Throat:     Mouth: Mucous membranes are moist.  Eyes:     General: No scleral icterus.    Conjunctiva/sclera: Conjunctivae normal.  Cardiovascular:     Rate and Rhythm: Normal rate and regular rhythm.     Heart sounds: No murmur heard. Pulmonary:     Effort: Pulmonary effort is normal.     Breath sounds: No stridor. No wheezing, rhonchi or rales.  Abdominal:     General: Abdomen is flat.     Palpations: There is no mass.     Tenderness: There is no abdominal tenderness. There is no guarding.     Hernia: No hernia is  present.  Musculoskeletal:        General: Normal range of motion.     Cervical back: Neck supple.     Right lower leg: No edema.     Left lower leg: No edema.  Lymphadenopathy:     Cervical: No cervical adenopathy.  Skin:    General: Skin is warm and dry.     Capillary Refill: Capillary refill takes less than 2 seconds.  Neurological:     General: No focal deficit present.     Mental Status: He is alert.  Psychiatric:        Mood and Affect: Mood normal.        Behavior: Behavior normal.     Lab Results  Component Value Date   WBC 6.0 11/18/2021   HGB 11.5 (L) 11/18/2021   HCT 33.1 (L) 11/18/2021   PLT 215.0 11/18/2021   GLUCOSE 115 (H) 11/18/2021   CHOL 149 05/06/2021   TRIG 262.0 (H) 11/18/2021   HDL 37.30 (L) 05/06/2021   LDLDIRECT 70.0 05/06/2021   LDLCALC 114  (H) 12/29/2019   ALT 30 10/07/2020   AST 29 10/07/2020   NA 138 11/18/2021   K 3.8 11/18/2021   CL 104 11/18/2021   CREATININE 1.76 (H) 11/18/2021   BUN 30 (H) 11/18/2021   CO2 24 11/18/2021   TSH 0.96 10/07/2020   PSA 0.36 05/06/2021   INR 1.0 12/29/2019   HGBA1C 5.3 12/29/2019    MYOCARDIAL PERFUSION IMAGING  Result Date: 10/16/2020  The left ventricular ejection fraction is mildly decreased (45-54%).  Nuclear stress EF: 47%. This may be an under estimation of true ejection fraction. Recent echocardiogram showed 65 to 70% EF.  There was no ST segment deviation noted during stress.  The study is normal.  This is a low risk study. No evidence of ischemia or infarction identified.  Donato SchultzMark Skains, MD   Assessment & Plan:   Barbara CowerJason was seen today for anemia and hypertension.  Diagnoses and all orders for this visit:  Primary hypertension- His BP is well controlled. -     Basic metabolic panel; Future -     CBC with Differential/Platelet; Future -     carvedilol (COREG) 3.125 MG tablet; Take 1 tablet (3.125 mg total) by mouth 2 (two) times daily with a meal. -     CBC with Differential/Platelet -     Basic metabolic panel -     TSH; Future  Hypertriglyceridemia -     Triglycerides; Future -     Triglycerides  Cerebrovascular accident (CVA) due to thrombosis of right posterior cerebral artery ([facility]) -     icosapent Ethyl (VASCEPA) 1 g capsule; Take 2 capsules (2 g total) by mouth 2 (two) times daily.  Primary hypertriglyceridemia -     icosapent Ethyl (VASCEPA) 1 g capsule; Take 2 capsules (2 g total) by mouth 2 (two) times daily.  Chronic renal disease, stage 2, mildly decreased glomerular filtration rate (GFR) between 60-89 mL/min/1.73 square meter- His renal function is normal now.  Deficiency anemia- Will evaluate for vitamin deficiencies. -     Vitamin B12; Future -     IBC + Ferritin; Future -     Zinc; Future -     Vitamin B1; Future -     Folate; Future -      Reticulocytes; Future   I am having Lavontae C. Woodrick maintain his Vitamin D-3, Vitamin C, Vitamin B-12, folic acid, atorvastatin, indapamide, aspirin EC, olmesartan, carvedilol, and  icosapent Ethyl.  Meds ordered this encounter  Medications   carvedilol (COREG) 3.125 MG tablet    Sig: Take 1 tablet (3.125 mg total) by mouth 2 (two) times daily with a meal.    Dispense:  180 tablet    Refill:  1   icosapent Ethyl (VASCEPA) 1 g capsule    Sig: Take 2 capsules (2 g total) by mouth 2 (two) times daily.    Dispense:  360 capsule    Refill:  1     Follow-up: No follow-ups on file.  Sanda Linger, MD

## 2021-11-19 DIAGNOSIS — D539 Nutritional anemia, unspecified: Secondary | ICD-10-CM | POA: Insufficient documentation

## 2021-11-20 ENCOUNTER — Telehealth: Payer: Self-pay

## 2021-11-20 NOTE — Telephone Encounter (Signed)
Key: Antonio Santos

## 2021-11-23 ENCOUNTER — Encounter: Payer: Self-pay | Admitting: Internal Medicine

## 2021-11-24 NOTE — Telephone Encounter (Signed)
Per CoverMyMeds:  PA ws denied.   Reason:  Pt does not med medical criteria.   Determination sent to scan.

## 2021-11-25 ENCOUNTER — Other Ambulatory Visit (INDEPENDENT_AMBULATORY_CARE_PROVIDER_SITE_OTHER): Payer: BC Managed Care – PPO

## 2021-11-25 DIAGNOSIS — D539 Nutritional anemia, unspecified: Secondary | ICD-10-CM | POA: Diagnosis not present

## 2021-11-25 DIAGNOSIS — I10 Essential (primary) hypertension: Secondary | ICD-10-CM | POA: Diagnosis not present

## 2021-11-25 LAB — IBC + FERRITIN
Ferritin: 410.5 ng/mL — ABNORMAL HIGH (ref 22.0–322.0)
Iron: 121 ug/dL (ref 42–165)
Saturation Ratios: 25.2 % (ref 20.0–50.0)
TIBC: 480.2 ug/dL — ABNORMAL HIGH (ref 250.0–450.0)
Transferrin: 343 mg/dL (ref 212.0–360.0)

## 2021-11-25 LAB — FOLATE: Folate: >23.9 ng/mL (ref >5.9)

## 2021-11-25 LAB — TSH: TSH: 3.54 u[IU]/mL (ref 0.35–5.50)

## 2021-11-25 LAB — VITAMIN B12: Vitamin B-12: 885 pg/mL (ref 211–911)

## 2021-12-01 LAB — RETICULOCYTES
ABS Retic: 88320 cells/uL (ref 25000–90000)
Retic Ct Pct: 2.3 %

## 2021-12-01 LAB — ZINC: Zinc: 79 ug/dL (ref 60–130)

## 2021-12-01 LAB — VITAMIN B1: Vitamin B1 (Thiamine): 10 nmol/L (ref 8–30)

## 2021-12-03 NOTE — Progress Notes (Unsigned)
[facility] [address] [place]. [place] [address] [phone]       OFFICE FOLLOW UP NOTE  [name] [facility] Date of Birth:  [birth year] Medical Record Number:  [id number]    Reason for visit: CPAP compliance visit    SUBJECTIVE:   CHIEF COMPLAINT:  No chief complaint on file.   HPI:   Update 12/04/2021 JM: Patient returns for yearly CPAP follow-up visit.  Review of compliance report below shows excellent usage and optimal residual AHI.  Hx of stroke, stable.  Closely follows with PCP Dr. Ronnald Ramp for aggressive stroke risk factor management.             History provided for reference purposes only Update 12/03/2020 JM: Antonio Santos returns for 6 months stroke and CPAP follow up unaccompanied. Overall doing well. No stroke symptoms. On aspirin and lipitor - no side effects. Blood pressure today 141/86. Routinely monitors at home - typically stable in AM and slightly high in PM. He has questions regarding current BP med use with recent adjustments by PCP for elevated BP.  Continues to do well on CPAP tolerating without difficulty.  Epworth Sleepiness Scale 3 (prior 6).  No further concerns at this time.        Update 05/08/2020 JM: Antonio Santos returns for scheduled 4-month stroke follow-up as well as initial CPAP compliance visit.  Doing well since prior visit reporting complete recovery without residual stroke deficits. Denies new stroke/TIA symptoms  Compliant on aspirin and atorvastatin -denies related side effects Reports fasting lab work in 04/2020 with PCP - all satisfactory  Blood pressure today 143/83 - monitors and typically 130s/70s  Remains on 123456 and folic acid [id number] 738 and homocysteine 26.5  Followed by Dr. Brett Fairy for sleep apnea with sleep study 12/21 showing severe sleep apnea with total AHI 93.2/h and prolonged hypoxemia.  Titration study showed successful treatment of both apnea and hypoxia under CPAP of 12.   AutoPap initiated and was started on 2/8.  Review of compliance report shows 77% compliance with residual AHI 0.3. he has used 30/30 days but typically sleeps only 3-7 hrs per night.  He has been tolerating CPAP well without difficulty.  He has had difficulty obtaining supplies through [facility].  Reports great improvement of daytime fatigue and energy levels.  ESS: 6 (prior 6) FSS: 23 (prior 19)  Initial visit 01/29/2020 JM: Antonio Santos is being seen for hospital follow-up accompanied by his wife.  He has been doing well since discharge with very slight left arm weakness and occasional short lasting left arm numbness. 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 Denies new or worsening stroke/TIA symptoms.  Hypercoagulable labs largely unremarkable except slightly elevated protein C and elevated homocysteine level during admission and has been taking B12 supplement.  Wife questions ongoing need or correct dosage amount of current B12 supplement. Remains on aspirin 81mg  daily without bleeding or bruising. Remains on atorvastatin 40mg  daily without myalgias. Blood pressure 128/76 with PCP recently initiating lisinopril-hydrochlorothiazide.  He was evaluated by Dr. Brett Fairy for possible underlying sleep apnea on 11/18 and currently awaiting to undergo sleep study.  No further concerns at this time.  Stroke admission 12/28/2019 Antonio Santos is a 46 y.o. male with history of obesity, back pain, and hypercholesterolemia who presented on 12/28/2019 at [facility] for visual disturbance, concerning for posterior circulation stroke with reported negative MRI and discharge to follow-up  with ophthalmology. At the opthamologist's office he developed R gaze, flacced LUE, LLE weakness and L facial droop. He then presented via EMS to [facility] ED for evaluation.  Personally reviewed hospitalization pertinent progress  notes, lab work and imaging with summary provided.  Evaluated by Dr. 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  Recommended DAPT for 3 weeks and aspirin alone.  LDL 114 and initiate atorvastatin 40 mg daily.  No history of DM or HTN.  Other stroke risk factors include suspected OSA and obesity but no prior stroke history.  Other active problems include AKI and chronic back pain.  Presenting symptoms resolved evaluated by therapies and was discharged home in stable condition without therapy needs.  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 Possible inferior medial R thalamic hypodensity. ASPECTS 10.    CTA head & neck no LVO CT perfusion no core infarct or penumbra MRI  Medial R thalamic infarct. Contralateral punctate medial L thalamic infarct. 2D Echo EF 65 to 70% LDL 114 HgbA1c 5.3 VTE prophylaxis - SCDs  No antithrombotic prior to admission, now on aspirin 81 mg daily and clopidogrel 75 mg daily. Continue DAPT x 3 weeks then aspirin alone Therapy recommendations:   None Disposition:   Home       ROS:   14 system review of systems performed and negative with exception of those listed in HPI  PMH: No past medical history on file.  PSH: No past surgical history on file.  Social History:  Social History   Socioeconomic History   Marital status: Married    Spouse name: Not on file   Number of children: Not on file   Years of education: Not on file   Highest education level: Not on file  Occupational History   Not on file  Tobacco Use   Smoking status: Never   Smokeless tobacco: Never  Substance and Sexual Activity   Alcohol use: Yes    Alcohol/week: 3.0 standard drinks of alcohol    Types: 3 Cans of beer per week   Drug  use: Never   Sexual activity: Yes    Partners: Female  Other Topics Concern   Not on file  Social History Narrative   Not on file   Social Determinants of Health   Financial Resource Strain: Not on file  Food Insecurity: Not on file  Transportation Needs: Not on file  Physical Activity: Not on file  Stress: Not on file  Social Connections: Not on file  Intimate Partner Violence: Not on file    Family History:  Family History  Problem Relation Age of Onset   Hypertension Mother    Hypertension Father     Medications:   Current Outpatient Medications on File Prior to Visit  Medication Sig Dispense Refill   Ascorbic Acid (VITAMIN C) 500 MG CAPS Take 500 mg by mouth daily.     aspirin 81 MG EC tablet Take 1 tablet (81 mg total) by mouth daily. Swallow whole. 90 tablet 1   atorvastatin (LIPITOR) 40 MG tablet Take 1 tablet (40 mg total) by mouth daily. 90 tablet 1   carvedilol (COREG) 3.125 MG tablet Take 1 tablet (3.125 mg total) by mouth 2 (two) times daily with a  meal. 180 tablet 1   Cholecalciferol (VITAMIN D-3) 125 MCG (5000 UT) TABS Take 1 tablet by mouth daily.     Cyanocobalamin (VITAMIN B-12) 5000 MCG TBDP Take 500 mcg by mouth daily.     folic acid (FOLVITE) 1 MG tablet Take 1 tablet (1 mg total) by mouth daily.     icosapent Ethyl (VASCEPA) 1 g capsule Take 2 capsules (2 g total) by mouth 2 (two) times daily. 360 capsule 1   indapamide (LOZOL) 1.25 MG tablet Take 1 tablet (1.25 mg total) by mouth daily. 90 tablet 1   olmesartan (BENICAR) 40 MG tablet TAKE 1 TABLET BY MOUTH EVERY DAY 90 tablet 0   No current facility-administered medications on file prior to visit.    Allergies:   Allergies  Allergen Reactions   Penicillins Other (See Comments)    Occurred when he was a child      OBJECTIVE:  Physical Exam  There were no vitals filed for this visit.  There is no height or weight on file to calculate BMI. No results found.  General: well developed, well  nourished,  pleasant middle-age Caucasian male, seated, in no evident distress Head: head normocephalic and atraumatic.   Neck: supple with no carotid or supraclavicular bruits Cardiovascular: regular rate and rhythm, no murmurs Musculoskeletal: no deformity Skin:  no rash/petichiae Vascular:  Normal pulses all extremities   Neurologic Exam Mental Status: Awake and fully alert.   Fluent speech and language.  Oriented to place and time. Recent and remote memory intact. Attention span, concentration and fund of knowledge appropriate. Mood and affect appropriate.  Cranial Nerves: Pupils equal, briskly reactive to light. Extraocular movements full without nystagmus. Visual fields full to confrontation. Hearing intact. Facial sensation intact. Face, tongue, palate moves normally and symmetrically.  Motor: Normal bulk and tone. Normal strength in all tested extremity muscles. Sensory.: intact to touch , pinprick , position and vibratory sensation.  Coordination: Rapid alternating movements normal in all extremities. Finger-to-nose and heel-to-shin performed accurately bilaterally. Gait and Station: Arises from chair without difficulty. Stance is normal. Gait demonstrates normal stride length and balance without use of assistive device. Reflexes: 1+ and symmetric. Toes downgoing.       ASSESSMENT/PLAN: Antonio Santos is a 46 y.o. 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    OSA on CPAP: Compliance report shows excellent compliance with optimal residual AHI Continue current pressure settings.  Continue to follow closely with DME for any needed supplies or CPAP related concerns.  B/l small punctate medial thalamic infarcts:  Recovered well without residual deficit Continue aspirin 81 mg daily  and atorvastatin for secondary stroke prevention.   Discussed secondary stroke prevention measures and importance of close PCP follow up for aggressive stroke risk factor management  HTN: BP goal <130/90.  Verified BP med regimen with Dr. Ronnald Ramp via Epic secure chat which was further discussed with patient - continue azilsartan, carvedilol and indapamide - patient verbalized understanding.  Continue to monitor at home HLD: LDL goal <70.  On atorvastatin 40 mg daily -recent lipid panel obtained by PCP - he will bring labs to office for further review      Follow up in 1 year for CPAP and continue to follow with PCP for continued aggressive stroke risk factor management and monitoring.  Advised to call sooner if needed  CC:  [facility] provider: Dr. Lenda Kelp, Arvid Right, MD    I spent 38 minutes of face-to-face and non-face-to-face time with patient.  This included previsit chart review, lab review, study review, order entry, electronic health record documentation, patient education and discussion regarding prior stroke and etiology, B12 and folate deficiency, OSA on CPAP with reviewing and discussion of compliance report, importance of managing stroke risk factors and answered all other questions to patient satisfaction  Frann Rider, AGNP-BC  [facility] [address] [place], [place] [address]  Phone [phone] Fax [phone] Note: This document was prepared with digital dictation and possible smart phrase technology. Any transcriptional errors that result from this process are unintentional.

## 2021-12-04 ENCOUNTER — Ambulatory Visit (INDEPENDENT_AMBULATORY_CARE_PROVIDER_SITE_OTHER): Payer: BC Managed Care – PPO | Admitting: Adult Health

## 2021-12-04 ENCOUNTER — Encounter: Payer: Self-pay | Admitting: Adult Health

## 2021-12-04 VITALS — BP 129/71 | HR 83 | Ht 72.0 in | Wt 302.2 lb

## 2021-12-04 DIAGNOSIS — G4733 Obstructive sleep apnea (adult) (pediatric): Secondary | ICD-10-CM

## 2021-12-04 DIAGNOSIS — I6381 Other cerebral infarction due to occlusion or stenosis of small artery: Secondary | ICD-10-CM | POA: Diagnosis not present

## 2021-12-04 DIAGNOSIS — Z9989 Dependence on other enabling machines and devices: Secondary | ICD-10-CM | POA: Diagnosis not present

## 2021-12-04 NOTE — Progress Notes (Signed)
Community message sent to DME: [facility] on file. New orders have been placed for the above pt, DOB: 02/20/1976 Thanks

## 2021-12-23 ENCOUNTER — Ambulatory Visit: Payer: BC Managed Care – PPO | Admitting: Internal Medicine

## 2021-12-24 ENCOUNTER — Encounter: Payer: Self-pay | Admitting: Internal Medicine

## 2021-12-24 DIAGNOSIS — I10 Essential (primary) hypertension: Secondary | ICD-10-CM

## 2021-12-24 MED ORDER — INDAPAMIDE 1.25 MG PO TABS: 1.2500 mg | ORAL_TABLET | Freq: Every day | ORAL | 1 refills | Status: DC

## 2022-01-03 ENCOUNTER — Other Ambulatory Visit: Payer: Self-pay | Admitting: Internal Medicine

## 2022-01-03 DIAGNOSIS — I10 Essential (primary) hypertension: Secondary | ICD-10-CM

## 2022-01-14 ENCOUNTER — Other Ambulatory Visit: Payer: Self-pay | Admitting: Internal Medicine

## 2022-01-14 DIAGNOSIS — E785 Hyperlipidemia, unspecified: Secondary | ICD-10-CM

## 2022-02-07 ENCOUNTER — Other Ambulatory Visit: Payer: Self-pay | Admitting: Internal Medicine

## 2022-02-07 DIAGNOSIS — I63331 Cerebral infarction due to thrombosis of right posterior cerebral artery: Secondary | ICD-10-CM

## 2022-03-04 IMAGING — MR MR MRA HEAD W/O CM
14 of 15 series · 38 of 48 positions shown · IV contrast (gadavist)
Comparison: CT head from the same day

CLINICAL DATA: Neuro deficit, acute stroke suspected. Acute double
vision.

EXAM:
MRI HEAD WITHOUT AND WITH CONTRAST
MRA HEAD WITHOUT CONTRAST
TECHNIQUE: Multiplanar, multiecho pulse sequences of the brain and surrounding
structures were obtained without and with intravenous contrast.
Angiographic images of the Circle of Willis were obtained using MRA
technique without intravenous contrast.
CONTRAST:  10mL GADAVIST GADOBUTROL 1 MMOL/ML IV SOLN

[Series 5: ax dwi_tracew · axial · 3.0mm · 0.60mm/px · z∈[-93,+62]mm · 2 of 48 slices shown]
[im 1/48]
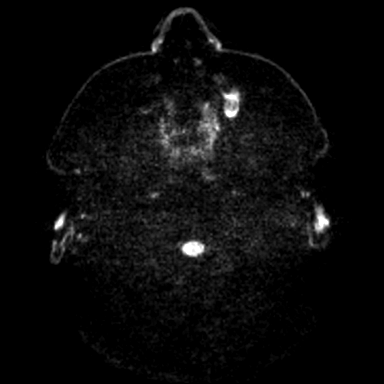
[im 48/48]
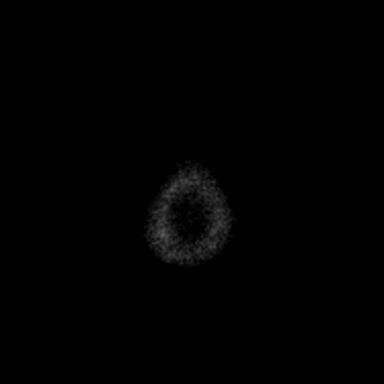

[Series 6: ax dwi_adc · axial · 3.0mm · 0.60mm/px · z∈[-93,+62]mm · 2 of 48 slices shown]
[im 1/48]
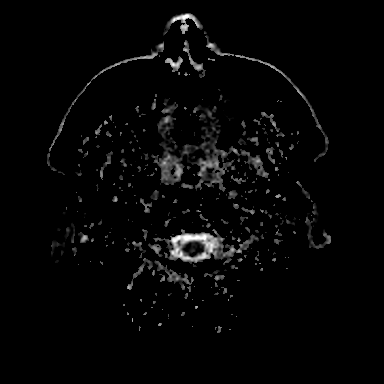
[im 48/48]
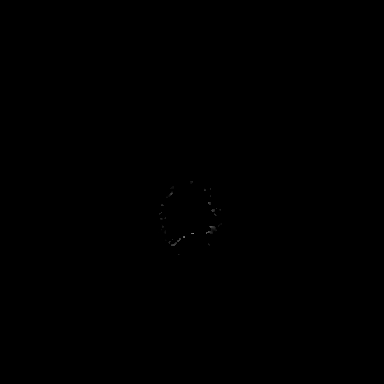

[Series 7: cor dwi_tracew · coronal · 5.0mm · 0.60mm/px · 2 of 40 slices shown]
[im 1/40]
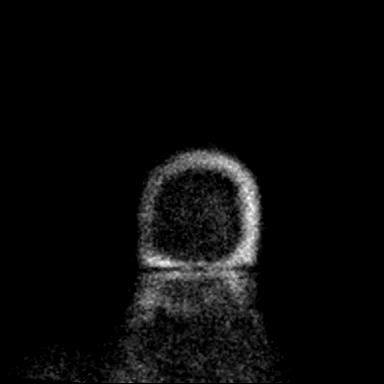
[im 40/40]
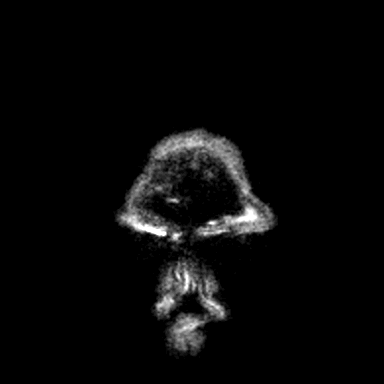

[Series 8: cor dwi_adc · coronal · 5.0mm · 0.60mm/px · 2 of 40 slices shown]
[im 1/40]
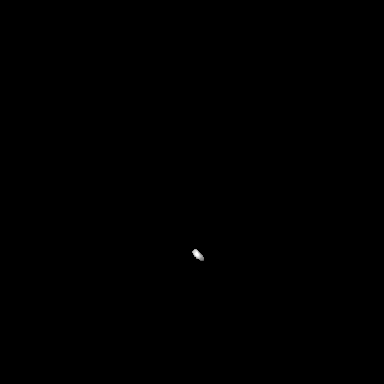
[im 40/40]
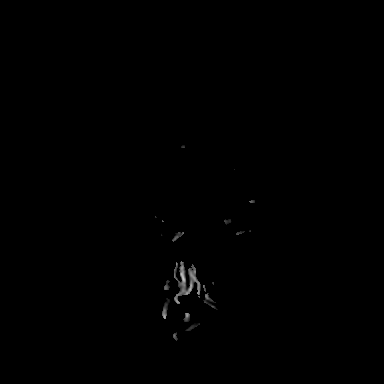

[Series 14: T1 · sagittal · 5.0mm · 0.62mm/px · 1 of 23 slices shown (1 of 2)]
[im 1/23]
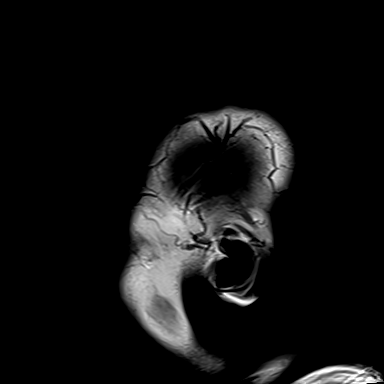

[Series 15: T2 · axial · 5.0mm · 0.53mm/px · 1 of 25 slices shown]
[im 1/25]
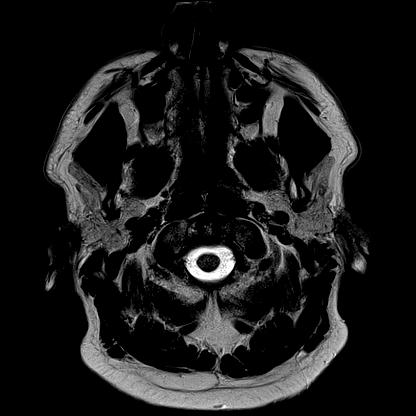

[Series 17: pha_images · axial · 3.0mm · 0.90mm/px · z∈[-101,+70]mm · 3 of 57 slices shown]
[im 1/57]
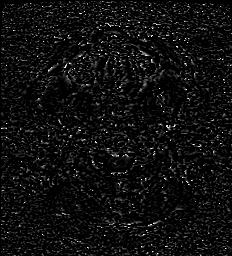
[im 29/57]
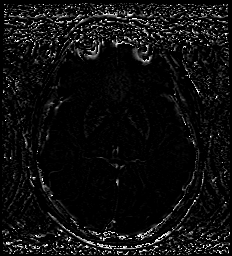
[im 57/57]
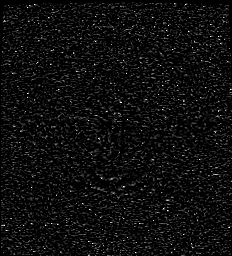

[Series 18: swi_images · axial · 3.0mm · 0.90mm/px · z∈[-104,+73]mm · 3 of 60 slices shown]
[im 1/60]
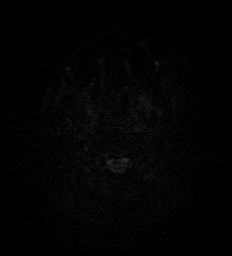
[im 30/60]
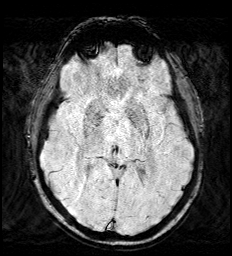
[im 60/60]
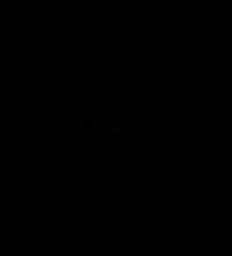

[Series 20: FLAIR · axial · 3.0mm · 0.53mm/px · z∈[-97,+65]mm · 3 of 55 slices shown]
[im 1/55]
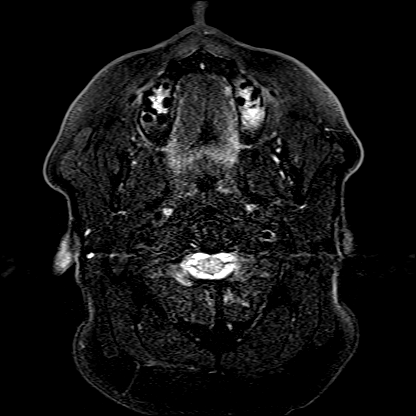
[im 28/55]
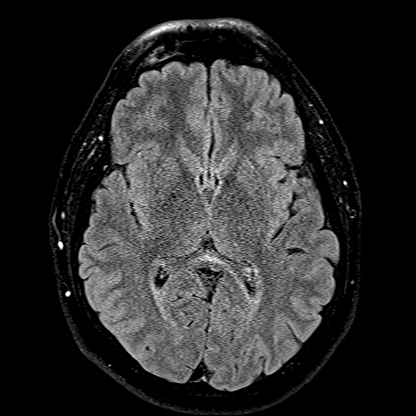
[im 55/55]
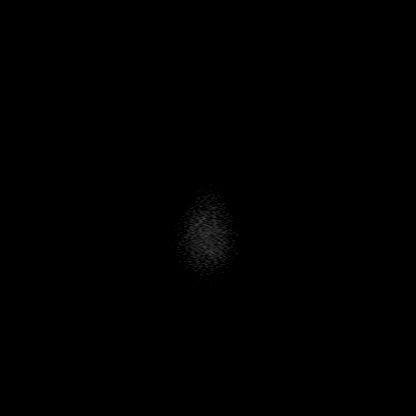

[Series 21: T1 · axial · 1.0mm · 0.98mm/px · z∈[-103,+72]mm · 8 of 176 slices shown (2 of 2)]
[im 1/176]
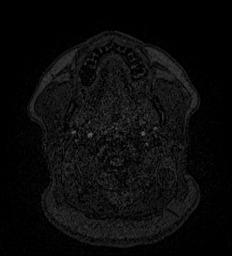
[im 26/176]
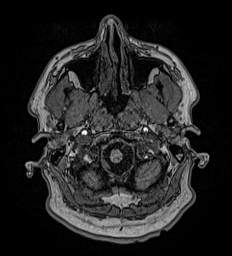
[im 51/176]
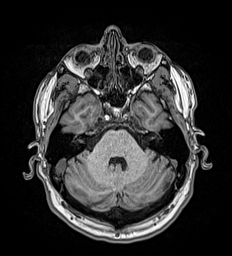
[im 76/176]
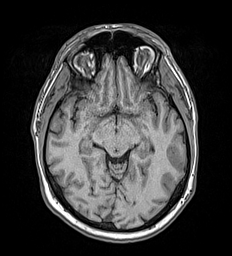
[im 101/176]
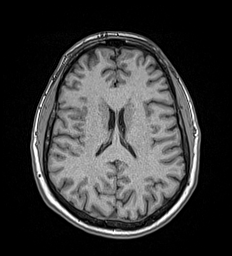
[im 126/176]
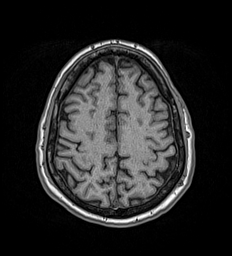
[im 151/176]
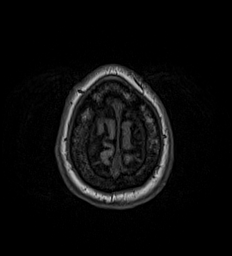
[im 176/176]
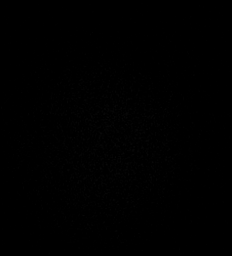

[Series 22: T2 post-contrast · coronal · 5.0mm · 0.57mm/px · 1 of 29 slices shown]
[im 1/29]
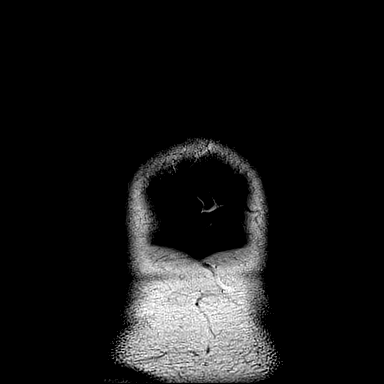

[Series 23: T1 post-contrast · axial · 1.0mm · 0.98mm/px · z∈[-103,+72]mm · 8 of 175 slices shown (1 of 3)]
[im 1/175]
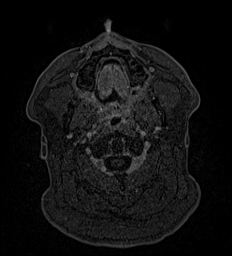
[im 25/175]
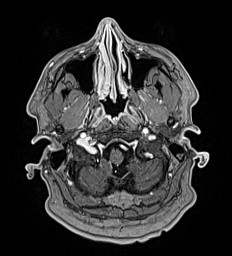
[im 50/175]
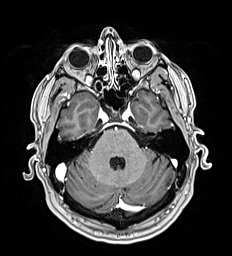
[im 75/175]
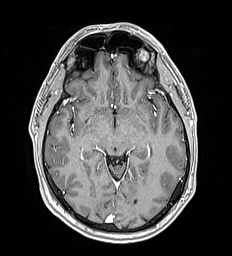
[im 100/175]
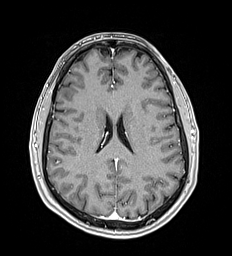
[im 125/175]
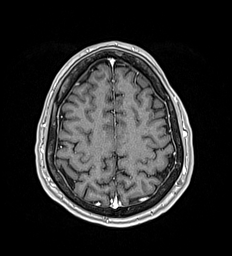
[im 150/175]
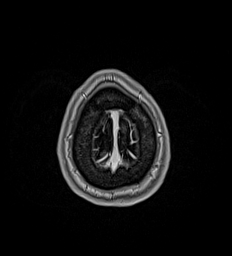
[im 175/175]
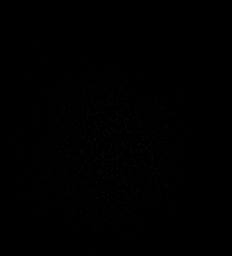

[Series 24: T1 post-contrast · coronal · 5.0mm · 0.57mm/px · 1 of 29 slices shown (2 of 3)]
[im 1/29]
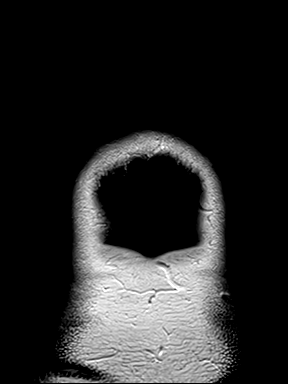

[Series 25: T1 post-contrast · sagittal · 5.0mm · 0.62mm/px · 1 of 23 slices shown (3 of 3)]
[im 1/23]
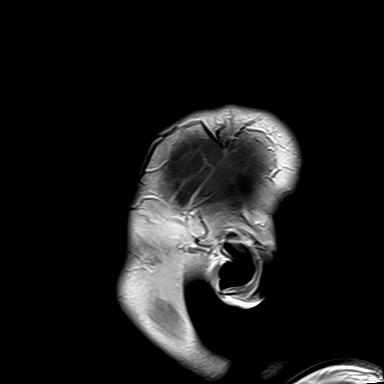

[38 of 48 positions shown; findings below may reference images not displayed]

FINDINGS: MRI HEAD FINDINGS

Brain: No acute infarction, hemorrhage, hydrocephalus, extra-axial
collection or mass lesion. Apparent DWI hyperintensity in the medial
right thalamus is favored artifactual given apparent linear artifact
through this region. No abnormal enhancement.

Skull and upper cervical spine: Normal marrow signal.

Sinuses/Orbits: Mild scattered ethmoid air cell mucosal thickening.
Unremarkable orbits.

Other: No mastoid effusions.

MRA HEAD FINDINGS

No evidence of significant (greater than 50%) stenosis or proximal
large vessel occlusion. No aneurysm. Diminutive right vertebral
artery (likely non dominant) which appears to terminate as PICA.
Bilateral posterior communicating arteries.
IMPRESSION: 1. No evidence of acute intracranial abnormality. Specifically, no
acute infarct.
2. No hemodynamically significant stenosis or proximal large vessel
occlusion.

## 2022-03-31 ENCOUNTER — Other Ambulatory Visit: Payer: Self-pay | Admitting: Internal Medicine

## 2022-03-31 DIAGNOSIS — I10 Essential (primary) hypertension: Secondary | ICD-10-CM

## 2022-04-29 ENCOUNTER — Other Ambulatory Visit: Payer: Self-pay | Admitting: Internal Medicine

## 2022-04-29 DIAGNOSIS — E7800 Pure hypercholesterolemia, unspecified: Secondary | ICD-10-CM

## 2022-05-12 ENCOUNTER — Other Ambulatory Visit: Payer: Self-pay | Admitting: Internal Medicine

## 2022-05-12 DIAGNOSIS — I10 Essential (primary) hypertension: Secondary | ICD-10-CM

## 2022-05-19 ENCOUNTER — Ambulatory Visit (INDEPENDENT_AMBULATORY_CARE_PROVIDER_SITE_OTHER): Payer: BC Managed Care – PPO | Admitting: Internal Medicine

## 2022-05-19 ENCOUNTER — Encounter: Payer: Self-pay | Admitting: Internal Medicine

## 2022-05-19 VITALS — BP 114/60 | HR 72 | Temp 98.2°F | Ht 72.0 in | Wt 284.0 lb

## 2022-05-19 DIAGNOSIS — I10 Essential (primary) hypertension: Secondary | ICD-10-CM | POA: Diagnosis not present

## 2022-05-19 DIAGNOSIS — R739 Hyperglycemia, unspecified: Secondary | ICD-10-CM | POA: Diagnosis not present

## 2022-05-19 DIAGNOSIS — Z1211 Encounter for screening for malignant neoplasm of colon: Secondary | ICD-10-CM

## 2022-05-19 DIAGNOSIS — E785 Hyperlipidemia, unspecified: Secondary | ICD-10-CM

## 2022-05-19 DIAGNOSIS — N1831 Chronic kidney disease, stage 3a: Secondary | ICD-10-CM

## 2022-05-19 DIAGNOSIS — Z0000 Encounter for general adult medical examination without abnormal findings: Secondary | ICD-10-CM

## 2022-05-19 DIAGNOSIS — N182 Chronic kidney disease, stage 2 (mild): Secondary | ICD-10-CM | POA: Diagnosis not present

## 2022-05-19 DIAGNOSIS — E781 Pure hyperglyceridemia: Secondary | ICD-10-CM | POA: Diagnosis not present

## 2022-05-19 DIAGNOSIS — Z0001 Encounter for general adult medical examination with abnormal findings: Secondary | ICD-10-CM

## 2022-05-19 LAB — BASIC METABOLIC PANEL
BUN: 43 mg/dL — ABNORMAL HIGH (ref 6–23)
CO2: 27 mEq/L (ref 19–32)
Calcium: 9.6 mg/dL (ref 8.4–10.5)
Chloride: 101 mEq/L (ref 96–112)
Creatinine, Ser: 2.51 mg/dL — ABNORMAL HIGH (ref 0.40–1.50)
GFR: 29.57 mL/min — ABNORMAL LOW (ref >60.00)
Glucose, Bld: 84 mg/dL (ref 70–99)
Potassium: 4.3 mEq/L (ref 3.5–5.1)
Sodium: 137 mEq/L (ref 135–145)

## 2022-05-19 LAB — CK: Total CK: 137 U/L (ref 7–232)

## 2022-05-19 LAB — HEMOGLOBIN A1C: Hgb A1c MFr Bld: 5.3 % (ref 4.6–6.5)

## 2022-05-19 LAB — TRIGLYCERIDES: Triglycerides: 201 mg/dL — ABNORMAL HIGH (ref 0.0–149.0)

## 2022-05-19 NOTE — Patient Instructions (Signed)
Health Maintenance, Male Adopting a healthy lifestyle and getting preventive care are important in promoting health and wellness. Ask your health care provider about: The right schedule for you to have regular tests and exams. Things you can do on your own to prevent diseases and keep yourself healthy. What should I know about diet, weight, and exercise? Eat a healthy diet  Eat a diet that includes plenty of vegetables, fruits, low-fat dairy products, and lean protein. Do not eat a lot of foods that are high in solid fats, added sugars, or sodium. Maintain a healthy weight Body mass index (BMI) is a measurement that can be used to identify possible weight problems. It estimates body fat based on height and weight. Your health care provider can help determine your BMI and help you achieve or maintain a healthy weight. Get regular exercise Get regular exercise. This is one of the most important things you can do for your health. Most adults should: Exercise for at least 150 minutes each week. The exercise should increase your heart rate and make you sweat (moderate-intensity exercise). Do strengthening exercises at least twice a week. This is in addition to the moderate-intensity exercise. Spend less time sitting. Even light physical activity can be beneficial. Watch cholesterol and blood lipids Have your blood tested for lipids and cholesterol at 48 years of age, then have this test every 5 years. You may need to have your cholesterol levels checked more often if: Your lipid or cholesterol levels are high. You are older than 48 years of age. You are at high risk for heart disease. What should I know about cancer screening? Many types of cancers can be detected early and may often be prevented. Depending on your health history and family history, you may need to have cancer screening at various ages. This may include screening for: Colorectal cancer. Prostate cancer. Skin cancer. Lung  cancer. What should I know about heart disease, diabetes, and high blood pressure? Blood pressure and heart disease High blood pressure causes heart disease and increases the risk of stroke. This is more likely to develop in people who have high blood pressure readings or are overweight. Talk with your health care provider about your target blood pressure readings. Have your blood pressure checked: Every 3-5 years if you are 18-39 years of age. Every year if you are 40 years old or older. If you are between the ages of 65 and 75 and are a current or former smoker, ask your health care provider if you should have a one-time screening for abdominal aortic aneurysm (AAA). Diabetes Have regular diabetes screenings. This checks your fasting blood sugar level. Have the screening done: Once every three years after age 45 if you are at a normal weight and have a low risk for diabetes. More often and at a younger age if you are overweight or have a high risk for diabetes. What should I know about preventing infection? Hepatitis B If you have a higher risk for hepatitis B, you should be screened for this virus. Talk with your health care provider to find out if you are at risk for hepatitis B infection. Hepatitis C Blood testing is recommended for: Everyone born from 1945 through 1965. Anyone with known risk factors for hepatitis C. Sexually transmitted infections (STIs) You should be screened each year for STIs, including gonorrhea and chlamydia, if: You are sexually active and are younger than 48 years of age. You are older than 48 years of age and your   health care provider tells you that you are at risk for this type of infection. Your sexual activity has changed since you were last screened, and you are at increased risk for chlamydia or gonorrhea. Ask your health care provider if you are at risk. Ask your health care provider about whether you are at high risk for HIV. Your health care provider  may recommend a prescription medicine to help prevent HIV infection. If you choose to take medicine to prevent HIV, you should first get tested for HIV. You should then be tested every 3 months for as long as you are taking the medicine. Follow these instructions at home: Alcohol use Do not drink alcohol if your health care provider tells you not to drink. If you drink alcohol: Limit how much you have to 0-2 drinks a day. Know how much alcohol is in your drink. In the [place] one drink equals one 12 oz bottle of beer (355 mL), one 5 oz glass of wine (148 mL), or one 1 oz glass of hard liquor (44 mL). Lifestyle Do not use any products that contain nicotine or tobacco. These products include cigarettes, chewing tobacco, and vaping devices, such as e-cigarettes. If you need help quitting, ask your health care provider. Do not use street drugs. Do not share needles. Ask your health care provider for help if you need support or information about quitting drugs. General instructions Schedule regular health, dental, and eye exams. Stay current with your vaccines. Tell your health care provider if: You often feel depressed. You have ever been abused or do not feel safe at home. Summary Adopting a healthy lifestyle and getting preventive care are important in promoting health and wellness. Follow your health care provider's instructions about healthy diet, exercising, and getting tested or screened for diseases. Follow your health care provider's instructions on monitoring your cholesterol and blood pressure. This information is not intended to replace advice given to you by your health care provider. Make sure you discuss any questions you have with your health care provider. Document Revised: 07/15/2020 Document Reviewed: 07/15/2020 Elsevier Patient Education  2023 [employer]  

## 2022-05-19 NOTE — Progress Notes (Signed)
Subjective:  Patient ID: Antonio Santos, male    DOB: [birth year]  Age: 47 y.o. MRN: [id number]  CC: Annual Exam, Hypertension, and Hyperlipidemia   HPI Antonio Santos presents for a CPX and f/up ----  He walks 45 minutes per day and has good endurance. He denies DOE, CP, edema, or fatigue. He has lost weight with lifestyle modifications.  Outpatient Medications Prior to Visit  Medication Sig Dispense Refill   Ascorbic Acid (VITAMIN C) 500 MG CAPS Take 500 mg by mouth daily.     ASPIRIN LOW DOSE 81 MG tablet TAKE 1 TABLET (81 MG TOTAL) BY MOUTH DAILY. SWALLOW WHOLE. 90 tablet 1   atorvastatin (LIPITOR) 40 MG tablet TAKE 1 TABLET BY MOUTH EVERY DAY 90 tablet 1   carvedilol (COREG) 3.125 MG tablet TAKE 1 TABLET BY MOUTH TWICE A DAY WITH A MEAL 180 tablet 0   Cholecalciferol (VITAMIN D-3) 125 MCG (5000 UT) TABS Take 1 tablet by mouth daily.     Cyanocobalamin (VITAMIN B-12) 5000 MCG TBDP Take 500 mcg by mouth daily.     fenofibrate micronized (LOFIBRA) 200 MG capsule 1 (ONE) CAPSULE BY MOUTH DAILY 90 capsule 1   folic acid (FOLVITE) 1 MG tablet Take 1 tablet (1 mg total) by mouth daily.     icosapent Ethyl (VASCEPA) 1 g capsule Take 2 capsules (2 g total) by mouth 2 (two) times daily. 360 capsule 1   olmesartan (BENICAR) 40 MG tablet TAKE 1 TABLET BY MOUTH EVERY DAY 90 tablet 0   indapamide (LOZOL) 1.25 MG tablet Take 1 tablet (1.25 mg total) by mouth daily. 90 tablet 1   No facility-administered medications prior to visit.    ROS Review of Systems  Constitutional: Negative.  Negative for chills, diaphoresis, fatigue and unexpected weight change.  HENT: Negative.    Eyes: Negative.   Respiratory:  Positive for apnea. Negative for cough, chest tightness, shortness of breath and wheezing.   Cardiovascular:  Negative for chest pain, palpitations and leg swelling.  Gastrointestinal:  Negative for constipation, diarrhea, nausea and vomiting.  Endocrine: Negative.    Genitourinary:  Negative for difficulty urinating and hematuria.  Musculoskeletal: Negative.   Skin: Negative.   Allergic/Immunologic: Negative.   Neurological: Negative.  Negative for dizziness and weakness.  Hematological:  Negative for adenopathy. Does not bruise/bleed easily.    Objective:  BP 114/60 (BP Location: Left Arm, Patient Position: Sitting, Cuff Size: Large)   Pulse 72   Temp 98.2 F (36.8 C) (Oral)   Ht 6' (1.829 m)   Wt 284 lb (128.8 kg)   SpO2 93%   BMI 38.52 kg/m   BP Readings from Last 3 Encounters:  05/19/22 114/60  12/04/21 129/71  11/18/21 136/72    Wt Readings from Last 3 Encounters:  05/19/22 284 lb (128.8 kg)  12/04/21 (!) 302 lb 4 oz (137.1 kg)  11/18/21 298 lb (135.2 kg)    Physical Exam Vitals reviewed.  HENT:     Nose: Nose normal.     Mouth/Throat:     Mouth: Mucous membranes are moist.  Eyes:     General: No scleral icterus.    Conjunctiva/sclera: Conjunctivae normal.  Cardiovascular:     Rate and Rhythm: Normal rate and regular rhythm.     Heart sounds: No murmur heard.    No gallop.  Pulmonary:     Effort: Pulmonary effort is normal.     Breath sounds: No stridor. No wheezing, rhonchi or rales.  Abdominal:  General: Abdomen is flat.     Palpations: There is no mass.     Tenderness: There is no abdominal tenderness. There is no guarding.     Hernia: No hernia is present.  Musculoskeletal:        General: Normal range of motion.     Cervical back: Neck supple.     Right lower leg: No edema.     Left lower leg: No edema.  Lymphadenopathy:     Cervical: No cervical adenopathy.  Skin:    General: Skin is warm and dry.     Coloration: Skin is not pale.  Neurological:     General: No focal deficit present.     Mental Status: He is alert. Mental status is at baseline.  Psychiatric:        Mood and Affect: Mood normal.        Behavior: Behavior normal.        Thought Content: Thought content normal.     Lab Results   Component Value Date   WBC 6.0 05/19/2022   HGB 11.6 (L) 05/19/2022   HCT 33.8 (L) 05/19/2022   PLT 235.0 05/19/2022   GLUCOSE 84 05/19/2022   CHOL 149 05/06/2021   TRIG 201.0 (H) 05/19/2022   HDL 37.30 (L) 05/06/2021   LDLDIRECT 70.0 05/06/2021   LDLCALC 114 (H) 12/29/2019   ALT 30 10/07/2020   AST 29 10/07/2020   NA 137 05/19/2022   K 4.3 05/19/2022   CL 101 05/19/2022   CREATININE 2.51 (H) 05/19/2022   BUN 43 (H) 05/19/2022   CO2 27 05/19/2022   TSH 3.54 11/25/2021   PSA 0.36 05/06/2021   INR 1.0 12/29/2019   HGBA1C 5.3 05/19/2022    MYOCARDIAL PERFUSION IMAGING  Result Date: 10/16/2020  The left ventricular ejection fraction is mildly decreased (45-54%).  Nuclear stress EF: 47%. This may be an under estimation of true ejection fraction. Recent echocardiogram showed 65 to 70% EF.  There was no ST segment deviation noted during stress.  The study is normal.  This is a low risk study. No evidence of ischemia or infarction identified.  Candee Furbish, MD   Assessment & Plan:   Sheamus was seen today for annual exam, hypertension and hyperlipidemia.  Diagnoses and all orders for this visit:  Primary hypertension- His renal function has declined and he is prerenal. His BP is slightly over-controlled.  Will discontinue the thiazide diuretic. -     CBC with Differential/Platelet; Future -     CBC with Differential/Platelet  Chronic renal disease, stage 2, mildly decreased glomerular filtration rate (GFR) between 60-89 mL/min/1.73 square meter -     Basic metabolic panel; Future -     Basic metabolic panel  Primary hypertriglyceridemia- Improvement noted. -     Triglycerides; Future -     Triglycerides  Chronic hyperglycemia -     Basic metabolic panel; Future -     Hemoglobin A1c; Future -     CK; Future -     CK -     Hemoglobin A1c -     Basic metabolic panel  Encounter for general adult medical examination with abnormal findings- Exam completed, labs reviewed,  vaccines reviewed and updated, cancer screenings addressed, patient education was given.  Dyslipidemia, goal LDL below 130- LDL goal achieved. Doing well on the statin  -     Triglycerides; Future -     CK; Future -     CK -  Triglycerides  Stage 3a chronic kidney disease ([place]) -     Ambulatory referral to Nephrology  Colon cancer screening -     Ambulatory referral to Gastroenterology   I have discontinued Malcolm C. Krasowski's indapamide. I am also having him maintain his Vitamin D-3, Vitamin C, Vitamin 0000000, folic acid, icosapent Ethyl, atorvastatin, Aspirin Low Dose, olmesartan, fenofibrate micronized, and carvedilol.  No orders of the defined types were placed in this encounter.    Follow-up: Return in about 6 months (around 11/19/2022).  Scarlette Calico, MD

## 2022-05-20 DIAGNOSIS — N1831 Chronic kidney disease, stage 3a: Secondary | ICD-10-CM | POA: Insufficient documentation

## 2022-05-20 LAB — CBC WITH DIFFERENTIAL/PLATELET
Basophils Absolute: 0 10*3/uL (ref 0.0–0.1)
Basophils Relative: 0.5 % (ref 0.0–3.0)
Eosinophils Absolute: 0.1 10*3/uL (ref 0.0–0.7)
Eosinophils Relative: 2.2 % (ref 0.0–5.0)
HCT: 33.8 % — ABNORMAL LOW (ref 39.0–52.0)
Hemoglobin: 11.6 g/dL — ABNORMAL LOW (ref 13.0–17.0)
Lymphocytes Relative: 32.7 % (ref 12.0–46.0)
Lymphs Abs: 1.9 10*3/uL (ref 0.7–4.0)
MCHC: 34.3 g/dL (ref 30.0–36.0)
MCV: 92.2 fl (ref 78.0–100.0)
Monocytes Absolute: 0.6 10*3/uL (ref 0.1–1.0)
Monocytes Relative: 10.5 % (ref 3.0–12.0)
Neutro Abs: 3.2 10*3/uL (ref 1.4–7.7)
Neutrophils Relative %: 54.1 % (ref 43.0–77.0)
Platelets: 235 10*3/uL (ref 150.0–400.0)
RBC: 3.66 Mil/uL — ABNORMAL LOW (ref 4.22–5.81)
RDW: 13.1 % (ref 11.5–15.5)
WBC: 6 10*3/uL (ref 4.0–10.5)

## 2022-06-19 ENCOUNTER — Other Ambulatory Visit: Payer: Self-pay | Admitting: Internal Medicine

## 2022-06-19 DIAGNOSIS — I10 Essential (primary) hypertension: Secondary | ICD-10-CM

## 2022-06-24 LAB — HM COLONOSCOPY

## 2022-06-25 ENCOUNTER — Other Ambulatory Visit: Payer: Self-pay | Admitting: Internal Medicine

## 2022-06-25 DIAGNOSIS — I129 Hypertensive chronic kidney disease with stage 1 through stage 4 chronic kidney disease, or unspecified chronic kidney disease: Secondary | ICD-10-CM

## 2022-06-25 DIAGNOSIS — I639 Cerebral infarction, unspecified: Secondary | ICD-10-CM

## 2022-06-25 DIAGNOSIS — E785 Hyperlipidemia, unspecified: Secondary | ICD-10-CM

## 2022-06-25 DIAGNOSIS — N1832 Chronic kidney disease, stage 3b: Secondary | ICD-10-CM

## 2022-06-25 DIAGNOSIS — N179 Acute kidney failure, unspecified: Secondary | ICD-10-CM

## 2022-07-01 ENCOUNTER — Other Ambulatory Visit: Payer: Self-pay | Admitting: Internal Medicine

## 2022-07-01 DIAGNOSIS — I10 Essential (primary) hypertension: Secondary | ICD-10-CM

## 2022-07-07 ENCOUNTER — Other Ambulatory Visit: Payer: Self-pay | Admitting: Internal Medicine

## 2022-07-07 DIAGNOSIS — E785 Hyperlipidemia, unspecified: Secondary | ICD-10-CM

## 2022-07-22 ENCOUNTER — Ambulatory Visit
Admission: RE | Admit: 2022-07-22 | Discharge: 2022-07-22 | Disposition: A | Discharge status: Home / Self Care | Payer: BC Managed Care – PPO | Source: Ambulatory Visit | Attending: Internal Medicine | Admitting: Internal Medicine

## 2022-07-22 DIAGNOSIS — I129 Hypertensive chronic kidney disease with stage 1 through stage 4 chronic kidney disease, or unspecified chronic kidney disease: Secondary | ICD-10-CM

## 2022-07-22 DIAGNOSIS — N179 Acute kidney failure, unspecified: Secondary | ICD-10-CM

## 2022-07-22 DIAGNOSIS — E785 Hyperlipidemia, unspecified: Secondary | ICD-10-CM

## 2022-07-22 DIAGNOSIS — I639 Cerebral infarction, unspecified: Secondary | ICD-10-CM

## 2022-07-22 DIAGNOSIS — N1832 Chronic kidney disease, stage 3b: Secondary | ICD-10-CM

## 2022-07-30 ENCOUNTER — Other Ambulatory Visit: Payer: Self-pay | Admitting: Internal Medicine

## 2022-07-30 DIAGNOSIS — I129 Hypertensive chronic kidney disease with stage 1 through stage 4 chronic kidney disease, or unspecified chronic kidney disease: Secondary | ICD-10-CM

## 2022-08-06 ENCOUNTER — Other Ambulatory Visit: Payer: Self-pay | Admitting: Internal Medicine

## 2022-08-06 DIAGNOSIS — I10 Essential (primary) hypertension: Secondary | ICD-10-CM

## 2022-08-06 DIAGNOSIS — I63331 Cerebral infarction due to thrombosis of right posterior cerebral artery: Secondary | ICD-10-CM

## 2022-08-14 ENCOUNTER — Other Ambulatory Visit: Payer: Self-pay | Admitting: Internal Medicine

## 2022-08-14 DIAGNOSIS — E7800 Pure hypercholesterolemia, unspecified: Secondary | ICD-10-CM

## 2022-08-19 LAB — LAB REPORT - SCANNED
Albumin, Urine POC: <3
Creatinine, POC: 41.1 mg/dL
EGFR: 64
Microalb Creat Ratio: <7

## 2022-09-08 ENCOUNTER — Other Ambulatory Visit: Payer: BC Managed Care – PPO

## 2022-10-12 ENCOUNTER — Other Ambulatory Visit: Payer: Self-pay | Admitting: Internal Medicine

## 2022-10-12 DIAGNOSIS — I10 Essential (primary) hypertension: Secondary | ICD-10-CM

## 2022-10-12 DIAGNOSIS — E785 Hyperlipidemia, unspecified: Secondary | ICD-10-CM

## 2022-10-22 ENCOUNTER — Encounter (INDEPENDENT_AMBULATORY_CARE_PROVIDER_SITE_OTHER): Payer: Self-pay

## 2022-11-16 ENCOUNTER — Encounter: Payer: Self-pay | Admitting: Internal Medicine

## 2022-11-16 ENCOUNTER — Ambulatory Visit (INDEPENDENT_AMBULATORY_CARE_PROVIDER_SITE_OTHER): Payer: BC Managed Care – PPO | Admitting: Internal Medicine

## 2022-11-16 VITALS — BP 118/56 | HR 83 | Temp 98.2°F | Resp 16 | Ht 72.0 in | Wt 277.0 lb

## 2022-11-16 DIAGNOSIS — D649 Anemia, unspecified: Secondary | ICD-10-CM | POA: Diagnosis not present

## 2022-11-16 DIAGNOSIS — I10 Essential (primary) hypertension: Secondary | ICD-10-CM

## 2022-11-16 DIAGNOSIS — N182 Chronic kidney disease, stage 2 (mild): Secondary | ICD-10-CM | POA: Diagnosis not present

## 2022-11-16 DIAGNOSIS — E785 Hyperlipidemia, unspecified: Secondary | ICD-10-CM

## 2022-11-16 NOTE — Progress Notes (Addendum)
Subjective:  Patient ID: Antonio Santos, male    DOB: March 22, 1975  Age: 47 y.o. MRN: [id number]  CC: Anemia and Hypertension   HPI Antonio Santos [facility] presents for f/up ----  Discussed the use of AI scribe software for clinical note transcription with the patient, who gave verbal consent to proceed.  History of Present Illness   The patient, who has been previously prescribed antihypertensive medication, reports a recent change in his medication regimen by another physician, Dr. Valentino Nose. The new medication, Azor 5/40, appears to be similar to the previous one but with an additional ingredient. The patient has been taking this new medication for approximately three months and is about to run out. He denies any side effects from the medication and reports that his blood pressure has been well-controlled. He denies any symptoms of headache, blurred vision, dizziness, lightheadedness, anemia, weakness, fatigue, shortness of breath, numbness, or tingling.  The patient maintains an active lifestyle, walking between three and four miles on most days of the week. He denies any leg swelling. He is due to travel and is concerned about running out of his medication during his trip. He requests to continue taking his previous medication until he returns.  He has been informed of a heart murmur in the past.       Outpatient Medications Prior to Visit  Medication Sig Dispense Refill   Ascorbic Acid (VITAMIN C) 500 MG CAPS Take 500 mg by mouth daily.     atorvastatin (LIPITOR) 40 MG tablet TAKE 1 TABLET BY MOUTH EVERY DAY 90 tablet 0   carvedilol (COREG) 3.125 MG tablet TAKE 1 TABLET BY MOUTH TWICE A DAY WITH FOOD 180 tablet 0   Cholecalciferol (VITAMIN D-3) 125 MCG (5000 UT) TABS Take 1 tablet by mouth daily.     [facility] ASPIRIN LOW DOSE 81 MG tablet TAKE 1 TABLET (81 MG TOTAL) BY MOUTH DAILY. SWALLOW WHOLE. 90 tablet 1   Cyanocobalamin (VITAMIN B-12) 5000 MCG TBDP Take 500 mcg by mouth daily.      fenofibrate micronized (LOFIBRA) 200 MG capsule TAKE 1 CAPSULE BY MOUTH EVERY DAY 90 capsule 1   folic acid (FOLVITE) 1 MG tablet Take 1 tablet (1 mg total) by mouth daily.     icosapent Ethyl (VASCEPA) 1 g capsule Take 2 capsules (2 g total) by mouth 2 (two) times daily. 360 capsule 1   olmesartan (BENICAR) 40 MG tablet TAKE 1 TABLET BY MOUTH EVERY DAY 90 tablet 0   No facility-administered medications prior to visit.    ROS Review of Systems  Constitutional: Negative.  Negative for diaphoresis and fatigue.  HENT: Negative.    Eyes: Negative.   Respiratory: Negative.  Negative for cough, chest tightness, shortness of breath and wheezing.   Cardiovascular:  Negative for chest pain, palpitations and leg swelling.  Gastrointestinal:  Negative for abdominal pain, diarrhea, nausea and vomiting.  Endocrine: Negative.   Genitourinary: Negative.   Musculoskeletal: Negative.   Skin: Negative.   Allergic/Immunologic: Negative.   Neurological: Negative.  Negative for dizziness.  Hematological:  Negative for adenopathy. Does not bruise/bleed easily.  Psychiatric/Behavioral: Negative.  Negative for agitation.     Objective:  BP (!) 118/56 (BP Location: Left Arm, Patient Position: Sitting, Cuff Size: Large)   Pulse 83   Temp 98.2 F (36.8 C) (Oral)   Resp 16   Ht 6' (1.829 m)   Wt 277 lb (125.6 kg)   SpO2 96%   BMI 37.57 kg/m   BP  Readings from Last 3 Encounters:  11/16/22 (!) 118/56  05/19/22 114/60  12/04/21 129/71    Wt Readings from Last 3 Encounters:  11/16/22 277 lb (125.6 kg)  05/19/22 284 lb (128.8 kg)  12/04/21 (!) 302 lb 4 oz (137.1 kg)    Physical Exam Vitals reviewed.  Constitutional:      Appearance: Normal appearance.  HENT:     Mouth/Throat:     Mouth: Mucous membranes are moist.  Eyes:     General: No scleral icterus.    Conjunctiva/sclera: Conjunctivae normal.  Cardiovascular:     Rate and Rhythm: Normal rate and regular rhythm.     Heart sounds: No  murmur heard. Pulmonary:     Effort: Pulmonary effort is normal.     Breath sounds: No stridor. No wheezing, rhonchi or rales.  Abdominal:     General: Abdomen is flat.     Palpations: There is no mass.     Tenderness: There is no abdominal tenderness. There is no guarding.     Hernia: No hernia is present.  Musculoskeletal:        General: Normal range of motion.     Cervical back: Neck supple.     Right lower leg: No edema.     Left lower leg: No edema.  Lymphadenopathy:     Cervical: No cervical adenopathy.  Skin:    General: Skin is warm and dry.  Neurological:     General: No focal deficit present.     Mental Status: He is alert. Mental status is at baseline.  Psychiatric:        Mood and Affect: Mood normal.        Behavior: Behavior normal.     Lab Results  Component Value Date   WBC 4.7 11/24/2022   HGB 13.1 11/24/2022   HCT 37.9 (L) 11/24/2022   PLT 228.0 11/24/2022   GLUCOSE 120 (H) 11/24/2022   CHOL 137 11/24/2022   TRIG 246.0 (H) 11/24/2022   HDL 43.80 11/24/2022   LDLDIRECT 70.0 05/06/2021   LDLCALC 44 11/24/2022   ALT 37 11/24/2022   AST 34 11/24/2022   NA 138 11/24/2022   K 3.8 11/24/2022   CL 103 11/24/2022   CREATININE 1.40 11/24/2022   BUN 19 11/24/2022   CO2 26 11/24/2022   TSH 3.54 11/25/2021   PSA 0.36 05/06/2021   INR 1.0 12/29/2019   HGBA1C 5.3 05/19/2022    US RENAL  Result Date: 07/22/2022 CLINICAL DATA:  CKD STAGE 3B EXAM: RENAL / URINARY TRACT ULTRASOUND COMPLETE COMPARISON:  None Available. FINDINGS: Right Kidney: Renal measurements: 10.5 x 5.0 x 6.1 cm = volume: 165 mL. Echogenicity within normal limits. No mass or hydronephrosis visualized. Left Kidney: Renal measurements: 12.0 x 5.7 x 5.9 cm = volume: 210 mL. Echogenicity within normal limits. No mass or hydronephrosis visualized. Bladder: Appears normal for degree of bladder distention. Other: None. IMPRESSION: No hydronephrosis. Electronically Signed   By: Meda Klinefelter M.D.    On: 07/22/2022 16:03    Assessment & Plan:   Chronic renal disease, stage 2, mildly decreased glomerular filtration rate (GFR) between 60-89 mL/min/1.73 square meter- I will monitor his renal function. -     Basic metabolic panel; Future -     Urinalysis, Routine w reflex microscopic; Future  Primary hypertension- His blood pressure is overcontrolled.  Will hold on any antihypertensives for now. -     Basic metabolic panel; Future -     CBC with Differential/Platelet;  Future -     Urinalysis, Routine w reflex microscopic; Future  Dyslipidemia, goal LDL below 130 -     Lipid panel; Future -     Hepatic function panel; Future  Anemia, unspecified type -     Testosterone Total,Free,Bio, Males; Future -     Hemoglobinopathy Evaluation; Future     Follow-up: No follow-ups on file.  Sanda Linger, MD

## 2022-11-19 ENCOUNTER — Ambulatory Visit: Payer: BC Managed Care – PPO | Admitting: Internal Medicine

## 2022-11-24 ENCOUNTER — Other Ambulatory Visit: Payer: Self-pay | Admitting: Internal Medicine

## 2022-11-24 ENCOUNTER — Other Ambulatory Visit (INDEPENDENT_AMBULATORY_CARE_PROVIDER_SITE_OTHER): Payer: BC Managed Care – PPO

## 2022-11-24 DIAGNOSIS — N182 Chronic kidney disease, stage 2 (mild): Secondary | ICD-10-CM | POA: Diagnosis not present

## 2022-11-24 DIAGNOSIS — E785 Hyperlipidemia, unspecified: Secondary | ICD-10-CM

## 2022-11-24 DIAGNOSIS — I10 Essential (primary) hypertension: Secondary | ICD-10-CM | POA: Diagnosis not present

## 2022-11-24 DIAGNOSIS — D649 Anemia, unspecified: Secondary | ICD-10-CM

## 2022-11-24 LAB — URINALYSIS, ROUTINE W REFLEX MICROSCOPIC
Bilirubin Urine: NEGATIVE
Hgb urine dipstick: NEGATIVE
Ketones, ur: NEGATIVE
Leukocytes,Ua: NEGATIVE
Nitrite: NEGATIVE
RBC / HPF: NONE SEEN (ref 0–?)
Specific Gravity, Urine: 1.015 (ref 1.000–1.030)
Total Protein, Urine: NEGATIVE
Urine Glucose: NEGATIVE
Urobilinogen, UA: 0.2 (ref 0.0–1.0)
pH: 7 (ref 5.0–8.0)

## 2022-11-24 LAB — CBC WITH DIFFERENTIAL/PLATELET
Basophils Absolute: 0 10*3/uL (ref 0.0–0.1)
Basophils Relative: 0.8 % (ref 0.0–3.0)
Eosinophils Absolute: 0.1 10*3/uL (ref 0.0–0.7)
Eosinophils Relative: 2.8 % (ref 0.0–5.0)
HCT: 37.9 % — ABNORMAL LOW (ref 39.0–52.0)
Hemoglobin: 13.1 g/dL (ref 13.0–17.0)
Lymphocytes Relative: 21.4 % (ref 12.0–46.0)
Lymphs Abs: 1 10*3/uL (ref 0.7–4.0)
MCHC: 34.6 g/dL (ref 30.0–36.0)
MCV: 90.9 fl (ref 78.0–100.0)
Monocytes Absolute: 0.3 10*3/uL (ref 0.1–1.0)
Monocytes Relative: 6.7 % (ref 3.0–12.0)
Neutro Abs: 3.2 10*3/uL (ref 1.4–7.7)
Neutrophils Relative %: 68.3 % (ref 43.0–77.0)
Platelets: 228 10*3/uL (ref 150.0–400.0)
RBC: 4.17 Mil/uL — ABNORMAL LOW (ref 4.22–5.81)
RDW: 13 % (ref 11.5–15.5)
WBC: 4.7 10*3/uL (ref 4.0–10.5)

## 2022-11-24 LAB — HEPATIC FUNCTION PANEL
ALT: 37 U/L (ref 0–53)
AST: 34 U/L (ref 0–37)
Albumin: 4.4 g/dL (ref 3.5–5.2)
Alkaline Phosphatase: 47 U/L (ref 39–117)
Bilirubin, Direct: 0.2 mg/dL (ref 0.0–0.3)
Total Bilirubin: 0.7 mg/dL (ref 0.2–1.2)
Total Protein: 7.1 g/dL (ref 6.0–8.3)

## 2022-11-24 LAB — LIPID PANEL
Cholesterol: 137 mg/dL (ref 0–200)
HDL: 43.8 mg/dL (ref >39.00)
LDL Cholesterol: 44 mg/dL (ref 0–99)
NonHDL: 93.54
Total CHOL/HDL Ratio: 3
Triglycerides: 246 mg/dL — ABNORMAL HIGH (ref 0.0–149.0)
VLDL: 49.2 mg/dL — ABNORMAL HIGH (ref 0.0–40.0)

## 2022-11-24 LAB — BASIC METABOLIC PANEL
BUN: 19 mg/dL (ref 6–23)
CO2: 26 meq/L (ref 19–32)
Calcium: 9.2 mg/dL (ref 8.4–10.5)
Chloride: 103 meq/L (ref 96–112)
Creatinine, Ser: 1.4 mg/dL (ref 0.40–1.50)
GFR: 59.36 mL/min — ABNORMAL LOW (ref >60.00)
Glucose, Bld: 120 mg/dL — ABNORMAL HIGH (ref 70–99)
Potassium: 3.8 meq/L (ref 3.5–5.1)
Sodium: 138 meq/L (ref 135–145)

## 2022-11-26 LAB — TESTOSTERONE TOTAL,FREE,BIO, MALES
Albumin: 4.7 g/dL (ref 3.6–5.1)
Sex Hormone Binding: 22 nmol/L (ref 10–50)
Testosterone: 194 ng/dL — ABNORMAL LOW (ref 250–827)

## 2022-11-26 LAB — HEMOGLOBINOPATHY EVALUATION
Fetal Hemoglobin Testing: <1 % (ref 0.0–1.9)
HCT: 39.6 % (ref 38.5–50.0)
Hemoglobin A2 - HGBRFX: 2.7 % (ref 2.0–3.2)
Hemoglobin: 13 g/dL — ABNORMAL LOW (ref 13.2–17.1)
Hgb A: 97.3 % (ref >96.0)
MCH: 31.3 pg (ref 27.0–33.0)
MCV: 95.4 fL (ref 80.0–100.0)
RBC: 4.15 10*6/uL — ABNORMAL LOW (ref 4.20–5.80)
RDW: 12.2 % (ref 11.0–15.0)

## 2022-11-26 NOTE — Progress Notes (Signed)
[facility] [address] [place]. [place] [address] [phone]       OFFICE FOLLOW UP NOTE  [name] [facility] Date of Birth:  26-Mar-1975 Medical Record Number:  [id number]    Reason for visit: CPAP compliance visit, hx of stroke    SUBJECTIVE:   CHIEF COMPLAINT:  Chief Complaint  Patient presents with   Follow-up    Patient in room #3 and alone. Patient states he been well and stable, with no new concerns.   Follow-up visit:  Prior visit: 12/04/2021  Brief HPI:   Antonio Santos is a 47 y.o. 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  Underwent sleep testing by Dr. Vickey Huger in 02/2020 which showed severe sleep apnea with total AHI 93.2/h and prolonged hypoxemia with successful treatment under CPAP therapy.  CPAP initiated 04/2020.  DME [facility]  At prior visit, compliance report showed excellent usage and optimal residual AHI. ESS 3/24. Stable from stroke standpoint.    Interval history:  Compliance report shows excellent usage and optimal residual AHI.  Continues to tolerate CPAP well. Continued great benefit with CPAP use. ESS 0/24 (prior to CPAP 6/24). Up to date on supplies.  No questions or concerns today.  No new stroke/TIA symptoms.  Compliant on medications. Blood pressure well controlled currently 128/74, reports recently receiving a refill for amlodipine-olmesartan for HTN control but he questions if this is needed, was initially started by nephrology about 3 months prior. Per recent visit with PCP Dr. Yetta Barre on 9/9, noted BP overcontrolled (BP at visit 118/56) and to hold off on antihypertensives but per patient, this medication was recently called in either by PCP or nephrology (unable to verify this via epic), he has not restarted taking.  Followed by PCP for stroke risk factor  management.            ROS:   14 system review of systems performed and negative with exception of those listed in HPI  PMH: History reviewed. No pertinent past medical history.  PSH: History reviewed. No pertinent surgical history.  Social History:  Social History   Socioeconomic History   Marital status: Married    Spouse name: Not on file   Number of children: Not on file   Years of education: Not on file   Highest education level: Not on file  Occupational History   Not on file  Tobacco Use   Smoking status: Never   Smokeless tobacco: Never  Substance and Sexual Activity   Alcohol use: Yes    Alcohol/week: 3.0 standard drinks of alcohol    Types: 3 Cans of beer per week   Drug use: Never   Sexual activity: Yes    Partners: Female  Other Topics Concern   Not on file  Social History Narrative   Not on file   Social Determinants of Health   Financial Resource Strain: Not on file  Food Insecurity: Not on file  Transportation Needs: Not on file  Physical Activity: Not on file  Stress: Not on file  Social Connections: Not on file  Intimate Partner Violence: Not on file    Family History:  Family History  Problem Relation Age of Onset   Hypertension Mother    Hypertension Father     Medications:   Current Outpatient Medications on File Prior to Visit  Medication Sig Dispense Refill   amLODipine-olmesartan (AZOR) 5-40 MG  tablet Take 1 tablet by mouth daily.     Ascorbic Acid (VITAMIN C) 500 MG CAPS Take 500 mg by mouth daily.     atorvastatin (LIPITOR) 40 MG tablet TAKE 1 TABLET BY MOUTH EVERY DAY 90 tablet 0   carvedilol (COREG) 3.125 MG tablet TAKE 1 TABLET BY MOUTH TWICE A DAY WITH FOOD 180 tablet 0   Cholecalciferol (VITAMIN D-3) 125 MCG (5000 UT) TABS Take 1 tablet by mouth daily.     [facility] ASPIRIN LOW DOSE 81 MG tablet TAKE 1 TABLET (81 MG TOTAL) BY MOUTH DAILY. SWALLOW WHOLE. 90 tablet 1   Cyanocobalamin (VITAMIN B-12) 5000 MCG TBDP Take 500  mcg by mouth daily.     fenofibrate micronized (LOFIBRA) 200 MG capsule TAKE 1 CAPSULE BY MOUTH EVERY DAY 90 capsule 1   folic acid (FOLVITE) 1 MG tablet Take 1 tablet (1 mg total) by mouth daily.     icosapent Ethyl (VASCEPA) 1 g capsule Take 2 capsules (2 g total) by mouth 2 (two) times daily. 360 capsule 1   No current facility-administered medications on file prior to visit.    Allergies:   Allergies  Allergen Reactions   Penicillins Other (See Comments)    Occurred when he was a child      OBJECTIVE:  Physical Exam  Vitals:   11/30/22 1019  BP: 128/74  Pulse: 75  Weight: 279 lb 12.8 oz (126.9 kg)  Height: 6' (1.829 m)   Body mass index is 37.95 kg/m. No results found.  General: well developed, well nourished,  pleasant middle-age Caucasian male, seated, in no evident distress Head: head normocephalic and atraumatic.   Neck: supple with no carotid or supraclavicular bruits Cardiovascular: regular rate and rhythm, no murmurs Musculoskeletal: no deformity Skin:  no rash/petichiae Vascular:  Normal pulses all extremities   Neurologic Exam Mental Status: Awake and fully alert.   Fluent speech and language.  Oriented to place and time. Recent and remote memory intact. Attention span, concentration and fund of knowledge appropriate. Mood and affect appropriate.  Cranial Nerves: Pupils equal, briskly reactive to light. Extraocular movements full without nystagmus. Visual fields full to confrontation. Hearing intact. Facial sensation intact. Face, tongue, palate moves normally and symmetrically.  Motor: Normal bulk and tone. Normal strength in all tested extremity muscles. Sensory.: intact to touch , pinprick , position and vibratory sensation.  Coordination: Rapid alternating movements normal in all extremities. Finger-to-nose and heel-to-shin performed accurately bilaterally. Gait and Station: Arises from chair without difficulty. Stance is normal. Gait demonstrates normal  stride length and balance without use of assistive device. Reflexes: 1+ and symmetric. Toes downgoing.        ASSESSMENT/PLAN: Antonio Santos is a 47 y.o. 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    OSA on CPAP: Compliance report shows excellent compliance with optimal residual AHI Continue current pressure settings.  Continue nightly use of CPAP with greater than 4 hours for optimal benefit.  Continue to follow closely with DME for any needed supplies or CPAP related concerns.  B/l small punctate medial thalamic infarcts:  Recovered well without residual deficit Continue aspirin 81 mg daily  and atorvastatin for secondary stroke prevention.  Discussed secondary stroke prevention measures and importance of close PCP follow up for aggressive stroke risk factor management including BP goal<130/90, and HLD with LDL goal<70  HTN: he  questions need of amlodipine-olmesartan which was initially started by nephrology, based on today's BP reading and recent BP reading with PCP, no clear indication for antihypertensive therapy but encouraged further discussion with PCP and/or nephrology.     Follow-up in 1 year or call earlier if needed    CC:  Etta Grandchild, MD    I spent 30 minutes of face-to-face and non-face-to-face time with patient.  This included previsit chart review, lab review, study review, order entry, electronic health record documentation, patient education and discussion regarding above diagnoses and treatment plan and answered all other questions to patient satisfaction  Ihor Austin, [facility]  [facility] [address] [place], [place] [address]  Phone [phone] Fax [phone] Note: This document was prepared with digital dictation and possible smart phrase technology. Any  transcriptional errors that result from this process are unintentional.

## 2022-11-29 ENCOUNTER — Encounter: Payer: Self-pay | Admitting: Internal Medicine

## 2022-11-30 ENCOUNTER — Other Ambulatory Visit: Payer: Self-pay | Admitting: Internal Medicine

## 2022-11-30 ENCOUNTER — Encounter: Payer: Self-pay | Admitting: Adult Health

## 2022-11-30 ENCOUNTER — Ambulatory Visit (INDEPENDENT_AMBULATORY_CARE_PROVIDER_SITE_OTHER): Payer: BC Managed Care – PPO | Admitting: Adult Health

## 2022-11-30 VITALS — BP 128/74 | HR 75 | Ht 72.0 in | Wt 279.8 lb

## 2022-11-30 DIAGNOSIS — I10 Essential (primary) hypertension: Secondary | ICD-10-CM

## 2022-11-30 DIAGNOSIS — I6381 Other cerebral infarction due to occlusion or stenosis of small artery: Secondary | ICD-10-CM

## 2022-11-30 DIAGNOSIS — E291 Testicular hypofunction: Secondary | ICD-10-CM | POA: Insufficient documentation

## 2022-11-30 DIAGNOSIS — G4733 Obstructive sleep apnea (adult) (pediatric): Secondary | ICD-10-CM

## 2022-11-30 MED ORDER — JATENZO 198 MG PO CAPS
1.0000 | ORAL_CAPSULE | Freq: Two times a day (BID) | ORAL | 0 refills | Status: DC
Start: 2022-11-30 — End: 2023-01-04

## 2022-11-30 NOTE — Patient Instructions (Signed)
Your Plan:  Continue nightly use of CPAP for adequate sleep apnea management  Continue to follow with your DME company [employer] for any needed supplies or CPAP related concerns  Continue routine follow-up with PCP for stroke risk factor management     Follow up in 1 year or call earlier if needed       Thank you for coming to see [place] at [facility]. I hope we have been able to provide you high quality care today.  You may receive a patient satisfaction survey over the next few weeks. We would appreciate your feedback and comments so that we may continue to improve ourselves and the health of our patients.

## 2022-12-03 ENCOUNTER — Ambulatory Visit: Payer: BC Managed Care – PPO | Admitting: Adult Health

## 2022-12-18 ENCOUNTER — Other Ambulatory Visit (HOSPITAL_COMMUNITY): Payer: Self-pay

## 2023-01-04 ENCOUNTER — Other Ambulatory Visit: Payer: Self-pay | Admitting: Internal Medicine

## 2023-01-04 DIAGNOSIS — E291 Testicular hypofunction: Secondary | ICD-10-CM

## 2023-01-11 ENCOUNTER — Other Ambulatory Visit: Payer: Self-pay | Admitting: Internal Medicine

## 2023-01-11 DIAGNOSIS — E291 Testicular hypofunction: Secondary | ICD-10-CM

## 2023-01-12 ENCOUNTER — Other Ambulatory Visit: Payer: Self-pay | Admitting: Internal Medicine

## 2023-01-12 DIAGNOSIS — I10 Essential (primary) hypertension: Secondary | ICD-10-CM

## 2023-01-12 DIAGNOSIS — E785 Hyperlipidemia, unspecified: Secondary | ICD-10-CM

## 2023-01-26 ENCOUNTER — Other Ambulatory Visit: Payer: Self-pay | Admitting: Internal Medicine

## 2023-01-26 DIAGNOSIS — E291 Testicular hypofunction: Secondary | ICD-10-CM

## 2023-02-07 ENCOUNTER — Other Ambulatory Visit: Payer: Self-pay | Admitting: Internal Medicine

## 2023-02-07 DIAGNOSIS — I63331 Cerebral infarction due to thrombosis of right posterior cerebral artery: Secondary | ICD-10-CM

## 2023-03-02 ENCOUNTER — Other Ambulatory Visit: Payer: Self-pay | Admitting: Internal Medicine

## 2023-03-02 DIAGNOSIS — E291 Testicular hypofunction: Secondary | ICD-10-CM

## 2023-03-31 ENCOUNTER — Telehealth: Payer: Self-pay | Admitting: Adult Health

## 2023-03-31 NOTE — Telephone Encounter (Signed)
Lauren with [employer] states a Rx for Cpap supplies was faxed 03/22/23. Would like to confirm if it was received. Requesting call back [phone]

## 2023-04-01 NOTE — Telephone Encounter (Signed)
Sent this message to [employer] for them to look into it. There is a active script from sept in epic. I checked go scripts to see if an order needed to be signed off on and it wasn't anything pending. Will let [employer] work this out.

## 2023-05-06 ENCOUNTER — Other Ambulatory Visit: Payer: Self-pay | Admitting: Internal Medicine

## 2023-05-06 DIAGNOSIS — I10 Essential (primary) hypertension: Secondary | ICD-10-CM

## 2023-05-18 ENCOUNTER — Ambulatory Visit (INDEPENDENT_AMBULATORY_CARE_PROVIDER_SITE_OTHER): Payer: BC Managed Care – PPO | Admitting: Internal Medicine

## 2023-05-18 ENCOUNTER — Encounter: Payer: Self-pay | Admitting: Internal Medicine

## 2023-05-18 VITALS — BP 142/66 | HR 77 | Temp 98.1°F | Resp 16 | Ht 72.0 in | Wt 283.6 lb

## 2023-05-18 DIAGNOSIS — E291 Testicular hypofunction: Secondary | ICD-10-CM

## 2023-05-18 DIAGNOSIS — E781 Pure hyperglyceridemia: Secondary | ICD-10-CM

## 2023-05-18 DIAGNOSIS — N5089 Other specified disorders of the male genital organs: Secondary | ICD-10-CM | POA: Diagnosis not present

## 2023-05-18 DIAGNOSIS — R739 Hyperglycemia, unspecified: Secondary | ICD-10-CM | POA: Diagnosis not present

## 2023-05-18 DIAGNOSIS — I10 Essential (primary) hypertension: Secondary | ICD-10-CM | POA: Diagnosis not present

## 2023-05-18 DIAGNOSIS — E669 Obesity, unspecified: Secondary | ICD-10-CM | POA: Diagnosis not present

## 2023-05-18 DIAGNOSIS — Z125 Encounter for screening for malignant neoplasm of prostate: Secondary | ICD-10-CM | POA: Diagnosis not present

## 2023-05-18 DIAGNOSIS — E785 Hyperlipidemia, unspecified: Secondary | ICD-10-CM

## 2023-05-18 DIAGNOSIS — I63331 Cerebral infarction due to thrombosis of right posterior cerebral artery: Secondary | ICD-10-CM

## 2023-05-18 DIAGNOSIS — Z6838 Body mass index (BMI) 38.0-38.9, adult: Secondary | ICD-10-CM | POA: Diagnosis not present

## 2023-05-18 DIAGNOSIS — K5730 Diverticulosis of large intestine without perforation or abscess without bleeding: Secondary | ICD-10-CM | POA: Insufficient documentation

## 2023-05-18 LAB — URINALYSIS, ROUTINE W REFLEX MICROSCOPIC
Bilirubin Urine: NEGATIVE
Hgb urine dipstick: NEGATIVE
Ketones, ur: NEGATIVE
Leukocytes,Ua: NEGATIVE
Nitrite: NEGATIVE
RBC / HPF: NONE SEEN (ref 0–?)
Specific Gravity, Urine: <=1.005 — AB (ref 1.000–1.030)
Total Protein, Urine: NEGATIVE
Urine Glucose: NEGATIVE
Urobilinogen, UA: 0.2 (ref 0.0–1.0)
WBC, UA: NONE SEEN (ref 0–?)
pH: 6 (ref 5.0–8.0)

## 2023-05-18 LAB — CBC WITH DIFFERENTIAL/PLATELET
Basophils Absolute: 0 10*3/uL (ref 0.0–0.1)
Basophils Relative: 0.8 % (ref 0.0–3.0)
Eosinophils Absolute: 0.1 10*3/uL (ref 0.0–0.7)
Eosinophils Relative: 2.1 % (ref 0.0–5.0)
HCT: 40.9 % (ref 39.0–52.0)
Hemoglobin: 13.8 g/dL (ref 13.0–17.0)
Lymphocytes Relative: 29.2 % (ref 12.0–46.0)
Lymphs Abs: 1.4 10*3/uL (ref 0.7–4.0)
MCHC: 33.8 g/dL (ref 30.0–36.0)
MCV: 91.1 fl (ref 78.0–100.0)
Monocytes Absolute: 0.3 10*3/uL (ref 0.1–1.0)
Monocytes Relative: 7.1 % (ref 3.0–12.0)
Neutro Abs: 3 10*3/uL (ref 1.4–7.7)
Neutrophils Relative %: 60.8 % (ref 43.0–77.0)
Platelets: 247 10*3/uL (ref 150.0–400.0)
RBC: 4.49 Mil/uL (ref 4.22–5.81)
RDW: 13.1 % (ref 11.5–15.5)
WBC: 4.9 10*3/uL (ref 4.0–10.5)

## 2023-05-18 LAB — BASIC METABOLIC PANEL
BUN: 16 mg/dL (ref 6–23)
CO2: 28 meq/L (ref 19–32)
Calcium: 10 mg/dL (ref 8.4–10.5)
Chloride: 102 meq/L (ref 96–112)
Creatinine, Ser: 1.36 mg/dL (ref 0.40–1.50)
GFR: 61.25 mL/min (ref >60.00)
Glucose, Bld: 103 mg/dL — ABNORMAL HIGH (ref 70–99)
Potassium: 4.7 meq/L (ref 3.5–5.1)
Sodium: 138 meq/L (ref 135–145)

## 2023-05-18 LAB — LIPID PANEL
Cholesterol: 142 mg/dL (ref 0–200)
HDL: 41.8 mg/dL (ref >39.00)
LDL Cholesterol: 53 mg/dL (ref 0–99)
NonHDL: 100.26
Total CHOL/HDL Ratio: 3
Triglycerides: 238 mg/dL — ABNORMAL HIGH (ref 0.0–149.0)
VLDL: 47.6 mg/dL — ABNORMAL HIGH (ref 0.0–40.0)

## 2023-05-18 LAB — PSA: PSA: 0.72 ng/mL (ref 0.10–4.00)

## 2023-05-18 LAB — TSH: TSH: 2.02 u[IU]/mL (ref 0.35–5.50)

## 2023-05-18 LAB — HEMOGLOBIN A1C: Hgb A1c MFr Bld: 5.1 % (ref 4.6–6.5)

## 2023-05-18 MED ORDER — ASPIRIN 81 MG PO TBEC: 81.0000 mg | DELAYED_RELEASE_TABLET | Freq: Every day | ORAL | 1 refills | Status: AC

## 2023-05-18 MED ORDER — ATORVASTATIN CALCIUM 40 MG PO TABS: 40.0000 mg | ORAL_TABLET | Freq: Every day | ORAL | 1 refills | Status: DC

## 2023-05-18 MED ORDER — AMLODIPINE-OLMESARTAN 5-40 MG PO TABS: 1.0000 | ORAL_TABLET | Freq: Every day | ORAL | 0 refills | Status: DC

## 2023-05-18 MED ORDER — ICOSAPENT ETHYL 1 G PO CAPS: 2.0000 g | ORAL_CAPSULE | Freq: Two times a day (BID) | ORAL | 1 refills | Status: DC

## 2023-05-18 NOTE — Progress Notes (Addendum)
 Subjective:  Patient ID: Antonio Santos, male    DOB: August 09, 1975  Age: 48 y.o. MRN: [id number]  CC: Hypertension and Hyperlipidemia   HPI [facility] presents for f/up ---  Discussed the use of AI scribe software for clinical note transcription with the patient, who gave verbal consent to proceed.  History of Present Illness   He presents with a mass on the right testicle.  He noticed swelling and a mass on the right testicle about a week ago following a back injury. The swelling has resolved, but the mass persists. He describes it as a 'knot' on the right side of his testicles. No associated pain or urinary symptoms such as dysuria or hematuria.  He has a history of hypertension and is on carvedilol, amlodipine with olmesartan, and atorvastatin. Home blood pressure readings are typically in the high 120s to low 130s systolic. He denies any breathing difficulties and uses a CPAP machine for sleep apnea.  He has a history of anemia but currently denies symptoms such as weakness, fatigue, numbness, tingling, or dyspnea. He feels tired after work but not upon waking and remains active without experiencing fatigue during activity.       Outpatient Medications Prior to Visit  Medication Sig Dispense Refill   carvedilol (COREG) 3.125 MG tablet TAKE 1 TABLET BY MOUTH TWICE A DAY WITH FOOD 60 tablet 0   Cholecalciferol (VITAMIN D-3) 125 MCG (5000 UT) TABS Take 1 tablet by mouth daily.     Cyanocobalamin (VITAMIN B-12) 5000 MCG TBDP Take 500 mcg by mouth daily.     fenofibrate micronized (LOFIBRA) 200 MG capsule TAKE 1 CAPSULE BY MOUTH EVERY DAY 90 capsule 1   folic acid (FOLVITE) 1 MG tablet Take 1 tablet (1 mg total) by mouth daily.     JATENZO 198 MG CAPS TAKE 1 CAPSULE TWICE A DAY WITH A MEAL 60 capsule 1   amLODipine-olmesartan (AZOR) 5-40 MG tablet Take 1 tablet by mouth daily.     Ascorbic Acid (VITAMIN C) 500 MG CAPS Take 500 mg by mouth daily.     ASPIRIN LOW DOSE 81 MG  tablet TAKE 1 TABLET (81 MG TOTAL) BY MOUTH DAILY. SWALLOW WHOLE. 90 tablet 1   atorvastatin (LIPITOR) 40 MG tablet TAKE 1 TABLET BY MOUTH EVERY DAY 90 tablet 0   icosapent Ethyl (VASCEPA) 1 g capsule Take 2 capsules (2 g total) by mouth 2 (two) times daily. 360 capsule 1   No facility-administered medications prior to visit.    ROS Review of Systems  Constitutional:  Positive for unexpected weight change (wt gain). Negative for appetite change, chills, diaphoresis and fatigue.  HENT: Negative.    Eyes: Negative.  Negative for visual disturbance.  Respiratory:  Positive for apnea. Negative for choking, shortness of breath and wheezing.   Cardiovascular:  Negative for chest pain, palpitations and leg swelling.  Gastrointestinal:  Negative for abdominal pain, constipation, diarrhea, nausea and vomiting.  Endocrine: Negative.   Genitourinary:  Positive for scrotal swelling. Negative for decreased urine volume, difficulty urinating, dysuria, hematuria, penile discharge, penile swelling and testicular pain.  Skin: Negative.   Neurological:  Negative for dizziness, weakness and light-headedness.  Hematological:  Negative for adenopathy. Does not bruise/bleed easily.  Psychiatric/Behavioral: Negative.      Objective:  BP (!) 142/66 (BP Location: Left Arm, Patient Position: Sitting, Cuff Size: Large)   Pulse 77   Temp 98.1 F (36.7 C) (Oral)   Resp 16   Ht 6' (1.829  m)   Wt 283 lb 9.6 oz (128.6 kg)   SpO2 94%   BMI 38.46 kg/m   BP Readings from Last 3 Encounters:  05/18/23 (!) 142/66  11/30/22 128/74  11/16/22 (!) 118/56    Wt Readings from Last 3 Encounters:  05/18/23 283 lb 9.6 oz (128.6 kg)  11/30/22 279 lb 12.8 oz (126.9 kg)  11/16/22 277 lb (125.6 kg)    Physical Exam Vitals reviewed.  Constitutional:      Appearance: He is obese.  HENT:     Nose: Nose normal.     Mouth/Throat:     Mouth: Mucous membranes are moist.  Eyes:     General: No scleral icterus.     Conjunctiva/sclera: Conjunctivae normal.  Cardiovascular:     Rate and Rhythm: Normal rate and regular rhythm.     Heart sounds: Normal heart sounds, S1 normal and S2 normal. No murmur heard.    No friction rub. No gallop.     Comments: EKG---  NSR, 76 bpm No LVH, Q waves, or ST/T wave changes  Pulmonary:     Effort: Pulmonary effort is normal.     Breath sounds: No stridor. No wheezing, rhonchi or rales.  Abdominal:     General: Abdomen is flat.     Palpations: There is no mass.     Tenderness: There is no abdominal tenderness. There is no guarding.     Hernia: No hernia is present. There is no hernia in the left inguinal area or right inguinal area.  Genitourinary:    Pubic Area: No rash.      Penis: Normal and circumcised.      Testes: Normal.        Right: Mass, tenderness, swelling, testicular hydrocele or varicocele not present. Right testis is descended.        Left: Mass, tenderness, swelling, testicular hydrocele or varicocele not present. Left testis is descended.     Epididymis:     Right: Not inflamed or enlarged. Mass present. No tenderness.     Left: Not inflamed or enlarged. No mass or tenderness.     Comments: There is a 1 cm hard mass in the tail of the epididymis  Musculoskeletal:        General: Normal range of motion.     Cervical back: Neck supple.     Right lower leg: No edema.     Left lower leg: No edema.  Lymphadenopathy:     Lower Body: No right inguinal adenopathy. No left inguinal adenopathy.  Skin:    General: Skin is warm and dry.     Findings: No rash.  Neurological:     General: No focal deficit present.     Mental Status: He is alert. Mental status is at baseline.  Psychiatric:        Mood and Affect: Mood normal.        Behavior: Behavior normal.     Lab Results  Component Value Date   WBC 4.9 05/18/2023   HGB 13.8 05/18/2023   HCT 40.9 05/18/2023   PLT 247.0 05/18/2023   GLUCOSE 103 (H) 05/18/2023   CHOL 142 05/18/2023   TRIG  238.0 (H) 05/18/2023   HDL 41.80 05/18/2023   LDLDIRECT 70.0 05/06/2021   LDLCALC 53 05/18/2023   ALT 37 11/24/2022   AST 34 11/24/2022   NA 138 05/18/2023   K 4.7 05/18/2023   CL 102 05/18/2023   CREATININE 1.36 05/18/2023   BUN 16 05/18/2023  CO2 28 05/18/2023   TSH 2.02 05/18/2023   PSA 0.72 05/18/2023   INR 1.0 12/29/2019   HGBA1C 5.1 05/18/2023    US RENAL Result Date: 07/22/2022 CLINICAL DATA:  CKD STAGE 3B EXAM: RENAL / URINARY TRACT ULTRASOUND COMPLETE COMPARISON:  None Available. FINDINGS: Right Kidney: Renal measurements: 10.5 x 5.0 x 6.1 cm = volume: 165 mL. Echogenicity within normal limits. No mass or hydronephrosis visualized. Left Kidney: Renal measurements: 12.0 x 5.7 x 5.9 cm = volume: 210 mL. Echogenicity within normal limits. No mass or hydronephrosis visualized. Bladder: Appears normal for degree of bladder distention. Other: None. IMPRESSION: No hydronephrosis. Electronically Signed   By: Meda Klinefelter M.D.   On: 07/22/2022 16:03    Assessment & Plan:   Primary hypertension- He has not achieved his BP goal. -     Basic metabolic panel; Future -     TSH; Future -     EKG 12-Lead -     Urinalysis, Routine w reflex microscopic; Future -     amLODIPine-Olmesartan; Take 1 tablet by mouth daily.  Dispense: 90 tablet; Refill: 0  Hypogonadism in male -     CBC with Differential/Platelet; Future -     Testosterone Total,Free,Bio, Males; Future  Obesity (BMI 35.0-39.9 without comorbidity) -     TSH; Future -     Hemoglobin A1c; Future  Dyslipidemia, goal LDL below 130 -     Lipid panel; Future -     TSH; Future -     Aspirin; Take 1 tablet (81 mg total) by mouth 5 (five) times daily. SWALLOW WHOLE.  Dispense: 90 tablet; Refill: 1 -     Atorvastatin Calcium; Take 1 tablet (40 mg total) by mouth daily.  Dispense: 90 tablet; Refill: 1  Chronic hyperglycemia -     Basic metabolic panel; Future -     Hemoglobin A1c; Future  Prostate cancer screening -      PSA; Future  Scrotal mass- Will evaluate for malignancy. 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  Dispense: 360 capsule; Refill: 1  Primary hypertriglyceridemia -     Icosapent Ethyl; Take 2 capsules (2 g total) by mouth 2 (two) times daily.  Dispense: 360 capsule; Refill: 1     Follow-up: Return in about 6 months (around 11/18/2023).  Sanda Linger, MD

## 2023-05-18 NOTE — Patient Instructions (Signed)
 Hypertension, Adult High blood pressure (hypertension) is when the force of blood pumping through the arteries is too strong. The arteries are the blood vessels that carry blood from the heart throughout the body. Hypertension forces the heart to work harder to pump blood and may cause arteries to become narrow or stiff. Untreated or uncontrolled hypertension can lead to a heart attack, heart failure, a stroke, kidney disease, and other problems. A blood pressure reading consists of a higher number over a lower number. Ideally, your blood pressure should be below 120/80. The first ("top") number is called the systolic pressure. It is a measure of the pressure in your arteries as your heart beats. The second ("bottom") number is called the diastolic pressure. It is a measure of the pressure in your arteries as the heart relaxes. What are the causes? The exact cause of this condition is not known. There are some conditions that result in high blood pressure. What increases the risk? Certain factors may make you more likely to develop high blood pressure. Some of these risk factors are under your control, including: Smoking. Not getting enough exercise or physical activity. Being overweight. Having too much fat, sugar, calories, or salt (sodium) in your diet. Drinking too much alcohol. Other risk factors include: Having a personal history of heart disease, diabetes, high cholesterol, or kidney disease. Stress. Having a family history of high blood pressure and high cholesterol. Having obstructive sleep apnea. Age. The risk increases with age. What are the signs or symptoms? High blood pressure may not cause symptoms. Very high blood pressure (hypertensive crisis) may cause: Headache. Fast or irregular heartbeats (palpitations). Shortness of breath. Nosebleed. Nausea and vomiting. Vision changes. Severe chest pain, dizziness, and seizures. How is this diagnosed? This condition is diagnosed by  measuring your blood pressure while you are seated, with your arm resting on a flat surface, your legs uncrossed, and your feet flat on the floor. The cuff of the blood pressure monitor will be placed directly against the skin of your upper arm at the level of your heart. Blood pressure should be measured at least twice using the same arm. Certain conditions can cause a difference in blood pressure between your right and left arms. If you have a high blood pressure reading during one visit or you have normal blood pressure with other risk factors, you may be asked to: Return on a different day to have your blood pressure checked again. Monitor your blood pressure at home for 1 week or longer. If you are diagnosed with hypertension, you may have other blood or imaging tests to help your health care provider understand your overall risk for other conditions. How is this treated? This condition is treated by making healthy lifestyle changes, such as eating healthy foods, exercising more, and reducing your alcohol intake. You may be referred for counseling on a healthy diet and physical activity. Your health care provider may prescribe medicine if lifestyle changes are not enough to get your blood pressure under control and if: Your systolic blood pressure is above 130. Your diastolic blood pressure is above 80. Your personal target blood pressure may vary depending on your medical conditions, your age, and other factors. Follow these instructions at home: Eating and drinking  Eat a diet that is high in fiber and potassium, and low in sodium, added sugar, and fat. An example of this eating plan is called the DASH diet. DASH stands for Dietary Approaches to Stop Hypertension. To eat this way: Eat  plenty of fresh fruits and vegetables. Try to fill one half of your plate at each meal with fruits and vegetables. Eat whole grains, such as whole-wheat pasta, brown rice, or whole-grain bread. Fill about one  fourth of your plate with whole grains. Eat or drink low-fat dairy products, such as skim milk or low-fat yogurt. Avoid fatty cuts of meat, processed or cured meats, and poultry with skin. Fill about one fourth of your plate with lean proteins, such as fish, chicken without skin, beans, eggs, or tofu. Avoid pre-made and processed foods. These tend to be higher in sodium, added sugar, and fat. Reduce your daily sodium intake. Many people with hypertension should eat less than 1,500 mg of sodium a day. Do not drink alcohol if: Your health care provider tells you not to drink. You are pregnant, may be pregnant, or are planning to become pregnant. If you drink alcohol: Limit how much you have to: 0-1 drink a day for women. 0-2 drinks a day for men. Know how much alcohol is in your drink. In the [place] one drink equals one 12 oz bottle of beer (355 mL), one 5 oz glass of wine (148 mL), or one 1 oz glass of hard liquor (44 mL). Lifestyle  Work with your health care provider to maintain a healthy body weight or to lose weight. Ask what an ideal weight is for you. Get at least 30 minutes of exercise that causes your heart to beat faster (aerobic exercise) most days of the week. Activities may include walking, swimming, or biking. Include exercise to strengthen your muscles (resistance exercise), such as Pilates or lifting weights, as part of your weekly exercise routine. Try to do these types of exercises for 30 minutes at least 3 days a week. Do not use any products that contain nicotine or tobacco. These products include cigarettes, chewing tobacco, and vaping devices, such as e-cigarettes. If you need help quitting, ask your health care provider. Monitor your blood pressure at home as told by your health care provider. Keep all follow-up visits. This is important. Medicines Take over-the-counter and prescription medicines only as told by your health care provider. Follow directions carefully. Blood  pressure medicines must be taken as prescribed. Do not skip doses of blood pressure medicine. Doing this puts you at risk for problems and can make the medicine less effective. Ask your health care provider about side effects or reactions to medicines that you should watch for. Contact a health care provider if you: Think you are having a reaction to a medicine you are taking. Have headaches that keep coming back (recurring). Feel dizzy. Have swelling in your ankles. Have trouble with your vision. Get help right away if you: Develop a severe headache or confusion. Have unusual weakness or numbness. Feel faint. Have severe pain in your chest or abdomen. Vomit repeatedly. Have trouble breathing. These symptoms may be an emergency. Get help right away. Call 911. Do not wait to see if the symptoms will go away. Do not drive yourself to the hospital. Summary Hypertension is when the force of blood pumping through your arteries is too strong. If this condition is not controlled, it may put you at risk for serious complications. Your personal target blood pressure may vary depending on your medical conditions, your age, and other factors. For most people, a normal blood pressure is less than 120/80. Hypertension is treated with lifestyle changes, medicines, or a combination of both. Lifestyle changes include losing weight, eating a healthy,  low-sodium diet, exercising more, and limiting alcohol. This information is not intended to replace advice given to you by your health care provider. Make sure you discuss any questions you have with your health care provider. Document Revised: 12/31/2020 Document Reviewed: 12/31/2020 Elsevier Patient Education  2024 [employer].

## 2023-05-19 ENCOUNTER — Encounter: Payer: Self-pay | Admitting: Internal Medicine

## 2023-05-19 LAB — TESTOSTERONE TOTAL,FREE,BIO, MALES
Albumin: 4.7 g/dL (ref 3.6–5.1)
Sex Hormone Binding: 18 nmol/L (ref 10–50)
Testosterone, Bioavailable: 106.9 ng/dL — ABNORMAL LOW (ref 110.0–575.0)
Testosterone, Free: 49.9 pg/mL (ref 46.0–224.0)
Testosterone: 260 ng/dL (ref 250–827)

## 2023-05-21 ENCOUNTER — Ambulatory Visit
Admission: RE | Admit: 2023-05-21 | Discharge: 2023-05-21 | Disposition: A | Discharge status: Home / Self Care | Source: Ambulatory Visit | Attending: Internal Medicine | Admitting: Internal Medicine

## 2023-05-21 DIAGNOSIS — N5089 Other specified disorders of the male genital organs: Secondary | ICD-10-CM

## 2023-05-22 ENCOUNTER — Encounter: Payer: Self-pay | Admitting: Internal Medicine

## 2023-06-05 ENCOUNTER — Other Ambulatory Visit: Payer: Self-pay | Admitting: Internal Medicine

## 2023-06-05 DIAGNOSIS — I10 Essential (primary) hypertension: Secondary | ICD-10-CM

## 2023-07-21 ENCOUNTER — Other Ambulatory Visit: Payer: Self-pay | Admitting: Internal Medicine

## 2023-07-21 DIAGNOSIS — E7800 Pure hypercholesterolemia, unspecified: Secondary | ICD-10-CM

## 2023-08-18 ENCOUNTER — Other Ambulatory Visit: Payer: Self-pay | Admitting: Internal Medicine

## 2023-08-18 DIAGNOSIS — E291 Testicular hypofunction: Secondary | ICD-10-CM

## 2023-09-15 ENCOUNTER — Other Ambulatory Visit: Payer: Self-pay | Admitting: Internal Medicine

## 2023-09-15 DIAGNOSIS — E291 Testicular hypofunction: Secondary | ICD-10-CM

## 2023-10-18 ENCOUNTER — Other Ambulatory Visit: Payer: Self-pay | Admitting: Internal Medicine

## 2023-10-18 DIAGNOSIS — E291 Testicular hypofunction: Secondary | ICD-10-CM

## 2023-11-02 ENCOUNTER — Other Ambulatory Visit: Payer: Self-pay | Admitting: Internal Medicine

## 2023-11-02 DIAGNOSIS — I10 Essential (primary) hypertension: Secondary | ICD-10-CM

## 2023-11-12 ENCOUNTER — Other Ambulatory Visit: Payer: Self-pay | Admitting: Internal Medicine

## 2023-11-12 DIAGNOSIS — E291 Testicular hypofunction: Secondary | ICD-10-CM

## 2023-11-16 NOTE — Telephone Encounter (Signed)
 Refill request for Jatenzo  198mg  60cap; Last fill 10/21/23; Last OV 05/18/23 for HTN. Pharmacy is [facility] ([place], [place]) - [place], [place] - [address]

## 2023-11-18 ENCOUNTER — Ambulatory Visit: Admitting: Internal Medicine

## 2023-11-20 ENCOUNTER — Other Ambulatory Visit: Payer: Self-pay | Admitting: Internal Medicine

## 2023-11-20 DIAGNOSIS — I10 Essential (primary) hypertension: Secondary | ICD-10-CM

## 2023-11-22 ENCOUNTER — Ambulatory Visit (INDEPENDENT_AMBULATORY_CARE_PROVIDER_SITE_OTHER): Admitting: Internal Medicine

## 2023-11-22 ENCOUNTER — Encounter: Payer: Self-pay | Admitting: Internal Medicine

## 2023-11-22 VITALS — BP 136/82 | HR 80 | Temp 98.4°F | Resp 16 | Ht 72.0 in | Wt 277.4 lb

## 2023-11-22 DIAGNOSIS — I10 Essential (primary) hypertension: Secondary | ICD-10-CM | POA: Diagnosis not present

## 2023-11-22 DIAGNOSIS — E291 Testicular hypofunction: Secondary | ICD-10-CM | POA: Diagnosis not present

## 2023-11-22 DIAGNOSIS — E781 Pure hyperglyceridemia: Secondary | ICD-10-CM

## 2023-11-22 DIAGNOSIS — I63331 Cerebral infarction due to thrombosis of right posterior cerebral artery: Secondary | ICD-10-CM

## 2023-11-22 DIAGNOSIS — E785 Hyperlipidemia, unspecified: Secondary | ICD-10-CM

## 2023-11-22 LAB — CBC WITH DIFFERENTIAL/PLATELET
Basophils Absolute: 0 K/uL (ref 0.0–0.1)
Basophils Relative: 0.8 % (ref 0.0–3.0)
Eosinophils Absolute: 0.1 K/uL (ref 0.0–0.7)
Eosinophils Relative: 2.9 % (ref 0.0–5.0)
HCT: 40.1 % (ref 39.0–52.0)
Hemoglobin: 13.7 g/dL (ref 13.0–17.0)
Lymphocytes Relative: 26.8 % (ref 12.0–46.0)
Lymphs Abs: 1.3 K/uL (ref 0.7–4.0)
MCHC: 34.3 g/dL (ref 30.0–36.0)
MCV: 89.5 fl (ref 78.0–100.0)
Monocytes Absolute: 0.5 K/uL (ref 0.1–1.0)
Monocytes Relative: 10.5 % (ref 3.0–12.0)
Neutro Abs: 2.8 K/uL (ref 1.4–7.7)
Neutrophils Relative %: 59 % (ref 43.0–77.0)
Platelets: 228 K/uL (ref 150.0–400.0)
RBC: 4.48 Mil/uL (ref 4.22–5.81)
RDW: 12.8 % (ref 11.5–15.5)
WBC: 4.7 K/uL (ref 4.0–10.5)

## 2023-11-22 NOTE — Patient Instructions (Signed)
 Hypertension, Adult High blood pressure (hypertension) is when the force of blood pumping through the arteries is too strong. The arteries are the blood vessels that carry blood from the heart throughout the body. Hypertension forces the heart to work harder to pump blood and may cause arteries to become narrow or stiff. Untreated or uncontrolled hypertension can lead to a heart attack, heart failure, a stroke, kidney disease, and other problems. A blood pressure reading consists of a higher number over a lower number. Ideally, your blood pressure should be below 120/80. The first ("top") number is called the systolic pressure. It is a measure of the pressure in your arteries as your heart beats. The second ("bottom") number is called the diastolic pressure. It is a measure of the pressure in your arteries as the heart relaxes. What are the causes? The exact cause of this condition is not known. There are some conditions that result in high blood pressure. What increases the risk? Certain factors may make you more likely to develop high blood pressure. Some of these risk factors are under your control, including: Smoking. Not getting enough exercise or physical activity. Being overweight. Having too much fat, sugar, calories, or salt (sodium) in your diet. Drinking too much alcohol. Other risk factors include: Having a personal history of heart disease, diabetes, high cholesterol, or kidney disease. Stress. Having a family history of high blood pressure and high cholesterol. Having obstructive sleep apnea. Age. The risk increases with age. What are the signs or symptoms? High blood pressure may not cause symptoms. Very high blood pressure (hypertensive crisis) may cause: Headache. Fast or irregular heartbeats (palpitations). Shortness of breath. Nosebleed. Nausea and vomiting. Vision changes. Severe chest pain, dizziness, and seizures. How is this diagnosed? This condition is diagnosed by  measuring your blood pressure while you are seated, with your arm resting on a flat surface, your legs uncrossed, and your feet flat on the floor. The cuff of the blood pressure monitor will be placed directly against the skin of your upper arm at the level of your heart. Blood pressure should be measured at least twice using the same arm. Certain conditions can cause a difference in blood pressure between your right and left arms. If you have a high blood pressure reading during one visit or you have normal blood pressure with other risk factors, you may be asked to: Return on a different day to have your blood pressure checked again. Monitor your blood pressure at home for 1 week or longer. If you are diagnosed with hypertension, you may have other blood or imaging tests to help your health care provider understand your overall risk for other conditions. How is this treated? This condition is treated by making healthy lifestyle changes, such as eating healthy foods, exercising more, and reducing your alcohol intake. You may be referred for counseling on a healthy diet and physical activity. Your health care provider may prescribe medicine if lifestyle changes are not enough to get your blood pressure under control and if: Your systolic blood pressure is above 130. Your diastolic blood pressure is above 80. Your personal target blood pressure may vary depending on your medical conditions, your age, and other factors. Follow these instructions at home: Eating and drinking  Eat a diet that is high in fiber and potassium, and low in sodium, added sugar, and fat. An example of this eating plan is called the DASH diet. DASH stands for Dietary Approaches to Stop Hypertension. To eat this way: Eat  plenty of fresh fruits and vegetables. Try to fill one half of your plate at each meal with fruits and vegetables. Eat whole grains, such as whole-wheat pasta, brown rice, or whole-grain bread. Fill about one  fourth of your plate with whole grains. Eat or drink low-fat dairy products, such as skim milk or low-fat yogurt. Avoid fatty cuts of meat, processed or cured meats, and poultry with skin. Fill about one fourth of your plate with lean proteins, such as fish, chicken without skin, beans, eggs, or tofu. Avoid pre-made and processed foods. These tend to be higher in sodium, added sugar, and fat. Reduce your daily sodium intake. Many people with hypertension should eat less than 1,500 mg of sodium a day. Do not drink alcohol if: Your health care provider tells you not to drink. You are pregnant, may be pregnant, or are planning to become pregnant. If you drink alcohol: Limit how much you have to: 0-1 drink a day for women. 0-2 drinks a day for men. Know how much alcohol is in your drink. In the [place] one drink equals one 12 oz bottle of beer (355 mL), one 5 oz glass of wine (148 mL), or one 1 oz glass of hard liquor (44 mL). Lifestyle  Work with your health care provider to maintain a healthy body weight or to lose weight. Ask what an ideal weight is for you. Get at least 30 minutes of exercise that causes your heart to beat faster (aerobic exercise) most days of the week. Activities may include walking, swimming, or biking. Include exercise to strengthen your muscles (resistance exercise), such as Pilates or lifting weights, as part of your weekly exercise routine. Try to do these types of exercises for 30 minutes at least 3 days a week. Do not use any products that contain nicotine or tobacco. These products include cigarettes, chewing tobacco, and vaping devices, such as e-cigarettes. If you need help quitting, ask your health care provider. Monitor your blood pressure at home as told by your health care provider. Keep all follow-up visits. This is important. Medicines Take over-the-counter and prescription medicines only as told by your health care provider. Follow directions carefully. Blood  pressure medicines must be taken as prescribed. Do not skip doses of blood pressure medicine. Doing this puts you at risk for problems and can make the medicine less effective. Ask your health care provider about side effects or reactions to medicines that you should watch for. Contact a health care provider if you: Think you are having a reaction to a medicine you are taking. Have headaches that keep coming back (recurring). Feel dizzy. Have swelling in your ankles. Have trouble with your vision. Get help right away if you: Develop a severe headache or confusion. Have unusual weakness or numbness. Feel faint. Have severe pain in your chest or abdomen. Vomit repeatedly. Have trouble breathing. These symptoms may be an emergency. Get help right away. Call 911. Do not wait to see if the symptoms will go away. Do not drive yourself to the hospital. Summary Hypertension is when the force of blood pumping through your arteries is too strong. If this condition is not controlled, it may put you at risk for serious complications. Your personal target blood pressure may vary depending on your medical conditions, your age, and other factors. For most people, a normal blood pressure is less than 120/80. Hypertension is treated with lifestyle changes, medicines, or a combination of both. Lifestyle changes include losing weight, eating a healthy,  low-sodium diet, exercising more, and limiting alcohol. This information is not intended to replace advice given to you by your health care provider. Make sure you discuss any questions you have with your health care provider. Document Revised: 12/31/2020 Document Reviewed: 12/31/2020 Elsevier Patient Education  2024 [employer].

## 2023-11-22 NOTE — Progress Notes (Unsigned)
 Subjective:  Patient ID: Antonio Santos, male    DOB: Jul 19, 1975  Age: 48 y.o. MRN: [id number]  CC: Hypertension and Hyperlipidemia   HPI Antonio Santos presents for f/up ---  Discussed the use of AI scribe software for clinical note transcription with the patient, who gave verbal consent to proceed.  History of Present Illness Antonio Santos is a 48 year old male with hypertension who presents for blood pressure management.  He experiences fluctuations in his blood pressure readings, which he monitors at home using a battery-operated machine. His blood pressure tends to be higher in the afternoon compared to the morning, with occasional high readings of 151/78-82. He reports that he sees more good blood pressure readings than high ones at home.  He is currently taking Azor  (amlodipine  and olmesartan ) at a dose of 5/20 mg at night and carvedilol , though the specific dose of carvedilol  is not mentioned. He reports no side effects from his medications.  He is physically active, walking three to five miles each night for work, and reports good endurance during these activities. He has been taking fish oil twice daily for the past 90 days to manage his triglycerides. No muscle or joint aches from his cholesterol medication.  No headaches, blurred vision, chest pain, shortness of breath, or fluid retention in his legs.     Outpatient Medications Prior to Visit  Medication Sig Dispense Refill   amLODipine -olmesartan  (AZOR ) 5-40 MG tablet TAKE 1 TABLET BY MOUTH EVERY DAY 90 tablet 0   aspirin  EC (ASPIRIN  LOW DOSE) 81 MG tablet Take 1 tablet (81 mg total) by mouth 5 (five) times daily. SWALLOW WHOLE. 90 tablet 1   atorvastatin  (LIPITOR ) 40 MG tablet Take 1 tablet (40 mg total) by mouth daily. 90 tablet 1   carvedilol  (COREG ) 3.125 MG tablet TAKE 1 TABLET BY MOUTH TWICE A DAY WITH FOOD 180 tablet 1   Cholecalciferol (VITAMIN D-3) 125 MCG (5000 UT) TABS Take 1 tablet by mouth  daily.     Cyanocobalamin (VITAMIN B-12) 5000 MCG TBDP Take 500 mcg by mouth daily.     fenofibrate micronized (LOFIBRA) 200 MG capsule TAKE 1 CAPSULE BY MOUTH EVERY DAY 90 capsule 1   folic acid  (FOLVITE ) 1 MG tablet Take 1 tablet (1 mg total) by mouth daily.     icosapent  Ethyl (VASCEPA ) 1 g capsule Take 2 capsules (2 g total) by mouth 2 (two) times daily. 360 capsule 1   JATENZO  198 MG CAPS TAKE 1 CAPSULE BY MOUTH TWICE A DAY WITH A MEAL 60 capsule 0   No facility-administered medications prior to visit.    ROS Review of Systems  Objective:  BP 136/82 (BP Location: Left Arm, Patient Position: Sitting, Cuff Size: Large)   Pulse 80   Temp 98.4 F (36.9 C) (Oral)   Resp 16   Ht 6' (1.829 m)   Wt 277 lb 6.4 oz (125.8 kg)   SpO2 96%   BMI 37.62 kg/m   BP Readings from Last 3 Encounters:  11/22/23 136/82  05/18/23 (!) 142/66  11/30/22 128/74    Wt Readings from Last 3 Encounters:  11/22/23 277 lb 6.4 oz (125.8 kg)  05/18/23 283 lb 9.6 oz (128.6 kg)  11/30/22 279 lb 12.8 oz (126.9 kg)    Physical Exam  Lab Results  Component Value Date   WBC 4.9 05/18/2023   HGB 13.8 05/18/2023   HCT 40.9 05/18/2023   PLT 247.0 05/18/2023   GLUCOSE 103 (H)  05/18/2023   CHOL 142 05/18/2023   TRIG 238.0 (H) 05/18/2023   HDL 41.80 05/18/2023   LDLDIRECT 70.0 05/06/2021   LDLCALC 53 05/18/2023   ALT 37 11/24/2022   AST 34 11/24/2022   NA 138 05/18/2023   K 4.7 05/18/2023   CL 102 05/18/2023   CREATININE 1.36 05/18/2023   BUN 16 05/18/2023   CO2 28 05/18/2023   TSH 2.02 05/18/2023   PSA 0.72 05/18/2023   INR 1.0 12/29/2019   HGBA1C 5.1 05/18/2023    US  SCROTUM W/DOPPLER Result Date: 05/21/2023 CLINICAL DATA:  Right scrotal mass EXAM: SCROTAL ULTRASOUND DOPPLER ULTRASOUND OF THE TESTICLES TECHNIQUE: Complete ultrasound examination of the testicles, epididymis, and other scrotal structures was performed. Color and spectral Doppler ultrasound were also utilized to evaluate blood  flow to the testicles. COMPARISON:  None Available. FINDINGS: Right testicle Measurements: 4.3 x 2.5 x 4.2 cm. No mass or microlithiasis visualized. Left testicle Measurements: 4.8 x 2.3 x 3.7 cm. No mass or microlithiasis visualized. Right epididymis:  2 mm epididymal cyst Left epididymis:  Normal in size and appearance. Hydrocele:  None visualized. Varicocele:  None visualized. Pulsed Doppler interrogation of both testes demonstrates normal low resistance arterial and venous waveforms bilaterally. Electronically Signed   By: Franky Chard M.D.   On: 05/21/2023 15:43    Assessment & Plan:  Primary hypertension -     Basic metabolic panel with GFR; Future -     CBC with Differential/Platelet; Future -     Hepatic function panel; Future  Primary hypertriglyceridemia -     Triglycerides; Future -     Hepatic function panel; Future  Hypogonadism in male -     CBC with Differential/Platelet; Future -     Hepatic function panel; Future -     Testosterone  Total,Free,Bio, Males; Future     Follow-up: No follow-ups on file.  Debby Molt, MD

## 2023-11-23 ENCOUNTER — Telehealth: Payer: Self-pay | Admitting: Adult Health

## 2023-11-23 LAB — BASIC METABOLIC PANEL WITH GFR
BUN: 17 mg/dL (ref 6–23)
CO2: 22 meq/L (ref 19–32)
Calcium: 10.2 mg/dL (ref 8.4–10.5)
Chloride: 104 meq/L (ref 96–112)
Creatinine, Ser: 1.49 mg/dL (ref 0.40–1.50)
GFR: 54.7 mL/min — ABNORMAL LOW (ref >60.00)
Glucose, Bld: 86 mg/dL (ref 70–99)
Potassium: 4.3 meq/L (ref 3.5–5.1)
Sodium: 137 meq/L (ref 135–145)

## 2023-11-23 LAB — HEPATIC FUNCTION PANEL
ALT: 32 U/L (ref 0–53)
AST: 38 U/L — ABNORMAL HIGH (ref 0–37)
Albumin: 4.9 g/dL (ref 3.5–5.2)
Alkaline Phosphatase: 43 U/L (ref 39–117)
Bilirubin, Direct: 0.1 mg/dL (ref 0.0–0.3)
Total Bilirubin: 0.4 mg/dL (ref 0.2–1.2)
Total Protein: 7.4 g/dL (ref 6.0–8.3)

## 2023-11-23 LAB — TESTOSTERONE TOTAL,FREE,BIO, MALES
Albumin: 4.8 g/dL (ref 3.6–5.1)
Sex Hormone Binding: 14 nmol/L (ref 10–50)
Testosterone, Bioavailable: 152.6 ng/dL (ref 110.0–575.0)
Testosterone, Free: 69.8 pg/mL (ref 46.0–224.0)
Testosterone: 310 ng/dL (ref 250–827)

## 2023-11-23 LAB — TRIGLYCERIDES: Triglycerides: 141 mg/dL (ref 0.0–149.0)

## 2023-11-23 LAB — CK: Total CK: 220 U/L (ref 17–232)

## 2023-11-23 MED ORDER — JATENZO 198 MG PO CAPS: 1.0000 | ORAL_CAPSULE | Freq: Two times a day (BID) | ORAL | 5 refills | Status: AC

## 2023-11-23 MED ORDER — CARVEDILOL 3.125 MG PO TABS: 3.1250 mg | ORAL_TABLET | Freq: Two times a day (BID) | ORAL | 1 refills | Status: AC

## 2023-11-23 MED ORDER — ICOSAPENT ETHYL 1 G PO CAPS: 2.0000 g | ORAL_CAPSULE | Freq: Two times a day (BID) | ORAL | 1 refills | Status: AC

## 2023-11-23 MED ORDER — ATORVASTATIN CALCIUM 40 MG PO TABS: 40.0000 mg | ORAL_TABLET | Freq: Every day | ORAL | 1 refills | Status: AC

## 2023-11-23 NOTE — Telephone Encounter (Signed)
 Patient reschedule appointment due to work schedule conflict.

## 2023-11-24 ENCOUNTER — Ambulatory Visit: Payer: Self-pay | Admitting: Internal Medicine

## 2023-11-30 ENCOUNTER — Ambulatory Visit: Payer: BC Managed Care – PPO | Admitting: Adult Health

## 2023-12-02 ENCOUNTER — Telehealth: Payer: Self-pay | Admitting: *Deleted

## 2023-12-02 NOTE — Telephone Encounter (Signed)
 Received supplies order request from [employer] below. However, pt last seen 11/30/22. Cx 11/30/23 and rescheduled to 05/11/24. He will need to be seen for updated visit first.  Phone room: please call pt and r/s for sooner appt due to above reason if pt asks. You can offer 02/22/24 at 10:45am with Harlene M,NP (I placed on hold). You can then also add to wait list.

## 2024-01-08 ENCOUNTER — Other Ambulatory Visit: Payer: Self-pay | Admitting: Internal Medicine

## 2024-01-08 DIAGNOSIS — E7800 Pure hypercholesterolemia, unspecified: Secondary | ICD-10-CM

## 2024-01-30 ENCOUNTER — Other Ambulatory Visit: Payer: Self-pay | Admitting: Internal Medicine

## 2024-01-30 DIAGNOSIS — I10 Essential (primary) hypertension: Secondary | ICD-10-CM

## 2024-02-22 ENCOUNTER — Encounter: Payer: Self-pay | Admitting: Adult Health

## 2024-02-22 ENCOUNTER — Ambulatory Visit: Admitting: Adult Health

## 2024-02-22 VITALS — BP 142/78 | HR 94 | Ht 72.0 in | Wt 286.0 lb

## 2024-02-22 DIAGNOSIS — G4733 Obstructive sleep apnea (adult) (pediatric): Secondary | ICD-10-CM

## 2024-02-22 DIAGNOSIS — I6381 Other cerebral infarction due to occlusion or stenosis of small artery: Secondary | ICD-10-CM | POA: Diagnosis not present

## 2024-02-22 NOTE — Progress Notes (Signed)
 [facility] [address] [place]. [place] [address] [phone]       OFFICE FOLLOW UP NOTE  [name] [facility] Date of Birth:  Sep 12, 1975 Medical Record Number:  [id number]    Reason for visit: CPAP compliance visit, hx of stroke    SUBJECTIVE:   CHIEF COMPLAINT:  Chief Complaint  Patient presents with   Follow-up    Pt in 8 alone Pt here for cpap f/u Pt states no questions or concerns for todays visit    Follow-up visit:  Prior visit: 11/30/2022   Brief HPI:   Antonio Santos is a 48 y.o. 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  Underwent sleep testing by Dr. Chalice in 02/2020 which showed severe sleep apnea with total AHI 93.2/h and prolonged hypoxemia with successful treatment under CPAP therapy.  ESS prior to CPAP 6/24. CPAP initiated 04/2020.  [facility]  At prior visit, compliance report showed excellent usage and optimal residual AHI. ESS 0/24. Stable from stroke standpoint.    Interval history:  Compliance report shows excellent usage and optimal residual AHI.  Continues to tolerate CPAP well. Continued great benefit with CPAP use. ESS 0/24 (prior to CPAP 6/24). Up to date on supplies.  Routinely follows with [facility].  No new stroke/TIA symptoms.  Continues close follow-up with PCP for stroke risk factor management.  Blood pressure today slightly elevated but this morning at home, BP at 130/80.  No questions or concerns today.            ROS:   14 system review of systems performed and negative with exception of those listed in HPI  PMH: History reviewed. No pertinent past medical history.  PSH: History reviewed. No pertinent surgical history.  Social History:  Social History   Socioeconomic History   Marital status: Married    Spouse name: Not on file   Number of children: Not  on file   Years of education: Not on file   Highest education level: Some college, no degree  Occupational History   Not on file  Tobacco Use   Smoking status: Never   Smokeless tobacco: Never  Vaping Use   Vaping status: Never Used  Substance and Sexual Activity   Alcohol use: Yes    Alcohol/week: 5.0 standard drinks of alcohol    Types: 5 Cans of beer per week   Drug use: Never   Sexual activity: Yes    Partners: Female  Other Topics Concern   Not on file  Social History Narrative   Pt lives with family    Pt works    Social Drivers of Health   Tobacco Use: Low Risk (02/22/2024)   Patient History    Smoking Tobacco Use: Never    Smokeless Tobacco Use: Never    Passive Exposure: Not on file  Financial Resource Strain: Low Risk (11/21/2023)   Overall Financial Resource Strain (CARDIA)    Difficulty of Paying Living Expenses: Not hard at all  Food Insecurity: No Food Insecurity (11/21/2023)   Epic    Worried About Programme Researcher, Broadcasting/film/video in the Last Year: Never true    Ran Out of Food in the Last Year: Never true  Transportation Needs: No Transportation Needs (11/21/2023)   Epic    Lack of Transportation (Medical): No    Lack of Transportation (Non-Medical): No  Physical Activity: Sufficiently Active (  11/21/2023)   Exercise Vital Sign    Days of Exercise per Week: 5 days    Minutes of Exercise per Session: 60 min  Stress: No Stress Concern Present (11/21/2023)   [employer] of Occupational Health - Occupational Stress Questionnaire    Feeling of Stress: Not at all  Social Connections: Moderately Isolated (11/21/2023)   Social Connection and Isolation Panel    Frequency of Communication with Friends and Family: More than three times a week    Frequency of Social Gatherings with Friends and Family: Three times a week    Attends Religious Services: Patient declined    Active Member of Clubs or Organizations: No    Attends Banker Meetings: Not on file     Marital Status: Married  Intimate Partner Violence: Not on file  Depression (PHQ2-9): Low Risk (05/18/2023)   Depression (PHQ2-9)    PHQ-2 Score: 0  Alcohol Screen: Low Risk (11/21/2023)   Alcohol Screen    Last Alcohol Screening Score (AUDIT): 2  Housing: Low Risk (11/21/2023)   Epic    Unable to Pay for Housing in the Last Year: No    Number of Times Moved in the Last Year: 0    Homeless in the Last Year: No  Utilities: Not on file  Health Literacy: Not on file    Family History:  Family History  Problem Relation Age of Onset   Hypertension Mother    Hypertension Father     Medications:   Current Outpatient Medications on File Prior to Visit  Medication Sig Dispense Refill   amLODipine -olmesartan  (AZOR ) 5-40 MG tablet TAKE 1 TABLET BY MOUTH EVERY DAY 90 tablet 0   aspirin  EC (ASPIRIN  LOW DOSE) 81 MG tablet Take 1 tablet (81 mg total) by mouth 5 (five) times daily. SWALLOW WHOLE. 90 tablet 1   atorvastatin  (LIPITOR ) 40 MG tablet Take 1 tablet (40 mg total) by mouth daily. 90 tablet 1   carvedilol  (COREG ) 3.125 MG tablet Take 1 tablet (3.125 mg total) by mouth 2 (two) times daily with a meal. 180 tablet 1   Cholecalciferol (VITAMIN D-3) 125 MCG (5000 UT) TABS Take 1 tablet by mouth daily.     Cyanocobalamin  (VITAMIN B-12) 5000 MCG TBDP Take 500 mcg by mouth daily.     fenofibrate micronized (LOFIBRA) 200 MG capsule TAKE 1 CAPSULE BY MOUTH EVERY DAY 90 capsule 1   folic acid  (FOLVITE ) 1 MG tablet Take 1 tablet (1 mg total) by mouth daily.     icosapent  Ethyl (VASCEPA ) 1 g capsule Take 2 capsules (2 g total) by mouth 2 (two) times daily. 360 capsule 1   JATENZO  198 MG CAPS Take 1 capsule by mouth 2 (two) times daily. 60 capsule 5   No current facility-administered medications on file prior to visit.    Allergies:   Allergies  Allergen Reactions   Penicillins Other (See Comments)    Occurred when he was a child      OBJECTIVE:  Physical Exam  Vitals:   02/22/24 1038   BP: (!) 142/78  Pulse: 94  Weight: 286 lb (129.7 kg)  Height: 6' (1.829 m)    Body mass index is 38.79 kg/m. No results found.  General: well developed, well nourished,  pleasant middle-age Caucasian male, seated, in no evident distress Head: head normocephalic and atraumatic.   Neck: supple with no carotid or supraclavicular bruits Cardiovascular: regular rate and rhythm, no murmurs  Neurologic Exam Mental Status: Awake and fully alert.  Fluent speech and language.  Oriented to place and time. Recent and remote memory intact. Attention span, concentration and fund of knowledge appropriate. Mood and affect appropriate.  Cranial Nerves: Pupils equal, briskly reactive to light. Extraocular movements full without nystagmus. Visual fields full to confrontation. Hearing intact. Facial sensation intact. Face, tongue, palate moves normally and symmetrically.  Motor: Normal bulk and tone. Normal strength in all tested extremity muscles. Coordination: Rapid alternating movements normal in all extremities. Finger-to-nose and heel-to-shin performed accurately bilaterally. Gait and Station: Arises from chair without difficulty. Stance is normal. Gait demonstrates normal stride length and balance without use of assistive device.       ASSESSMENT/PLAN: Antonio Santos is a 48 y.o. 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    OSA on CPAP: Compliance report shows excellent compliance with optimal residual AHI Continue current pressure settings of 4-15 with EPR 3.   Continue nightly use of CPAP with greater than 4 hours for optimal benefit.   Continue to follow closely with [facility] for any needed supplies or CPAP related concerns. CPAP set up 04/2020, due for new machine 04/2025  B/l small punctate medial thalamic infarcts:  Recovered well  without residual deficit Continue aspirin  81 mg daily  and atorvastatin  for secondary stroke prevention manage/prescribed by PCP Discussed secondary stroke prevention measures and importance of close PCP follow up for aggressive stroke risk factor management including BP goal<130/90, and HLD with LDL goal<70      Follow-up in 1 year in office per patient request or call earlier if needed    CC:  Joshua Debby CROME, MD     Harlene Bogaert, AGNP-BC  [facility] [address] [place], [place] [address]  Phone [phone] Fax [phone] Note: This document was prepared with digital dictation and possible smart phrase technology. Any transcriptional errors that result from this process are unintentional.

## 2024-05-11 ENCOUNTER — Ambulatory Visit: Admitting: Adult Health

## 2024-05-22 ENCOUNTER — Ambulatory Visit: Admitting: Internal Medicine

## 2025-02-22 ENCOUNTER — Ambulatory Visit: Admitting: Adult Health
